# Patient Record
Sex: Male | Born: 1940
Health system: Southern US, Community
[De-identification: ages and names within clinical notes are randomized; demographics above are authoritative.]

## PROBLEM LIST (undated history)

## (undated) DIAGNOSIS — J189 Pneumonia, unspecified organism: Secondary | ICD-10-CM

## (undated) DIAGNOSIS — Z87828 Personal history of other (healed) physical injury and trauma: Secondary | ICD-10-CM

## (undated) DIAGNOSIS — G629 Polyneuropathy, unspecified: Secondary | ICD-10-CM

## (undated) DIAGNOSIS — H9193 Unspecified hearing loss, bilateral: Secondary | ICD-10-CM

## (undated) DIAGNOSIS — E785 Hyperlipidemia, unspecified: Secondary | ICD-10-CM

## (undated) DIAGNOSIS — Z87438 Personal history of other diseases of male genital organs: Secondary | ICD-10-CM

## (undated) DIAGNOSIS — Z87898 Personal history of other specified conditions: Secondary | ICD-10-CM

## (undated) DIAGNOSIS — G839 Paralytic syndrome, unspecified: Secondary | ICD-10-CM

## (undated) DIAGNOSIS — M1990 Unspecified osteoarthritis, unspecified site: Secondary | ICD-10-CM

## (undated) DIAGNOSIS — N400 Enlarged prostate without lower urinary tract symptoms: Secondary | ICD-10-CM

## (undated) DIAGNOSIS — S3992XA Unspecified injury of lower back, initial encounter: Secondary | ICD-10-CM

## (undated) DIAGNOSIS — Z974 Presence of external hearing-aid: Secondary | ICD-10-CM

## (undated) DIAGNOSIS — Z9289 Personal history of other medical treatment: Secondary | ICD-10-CM

## (undated) DIAGNOSIS — N390 Urinary tract infection, site not specified: Secondary | ICD-10-CM

## (undated) DIAGNOSIS — Z973 Presence of spectacles and contact lenses: Secondary | ICD-10-CM

## (undated) DIAGNOSIS — Z972 Presence of dental prosthetic device (complete) (partial): Secondary | ICD-10-CM

## (undated) HISTORY — PX: HERNIA REPAIR: SHX51

## (undated) HISTORY — DX: Hyperlipidemia, unspecified: E78.5

## (undated) HISTORY — DX: Personal history of other medical treatment: Z92.89

## (undated) HISTORY — DX: Unspecified injury of lower back, initial encounter: S39.92XA

## (undated) HISTORY — DX: Polyneuropathy, unspecified: G62.9

---
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
## 1989-10-26 HISTORY — PX: REPAIR OF RECTAL PROLAPSE: SHX6420

## 1989-10-26 HISTORY — PX: RECTAL PROLAPSE REPAIR: SHX759

## 1990-10-26 DIAGNOSIS — Z87828 Personal history of other (healed) physical injury and trauma: Secondary | ICD-10-CM

## 1990-10-26 HISTORY — DX: Personal history of other (healed) physical injury and trauma: Z87.828

## 1990-10-26 HISTORY — PX: OTHER SURGICAL HISTORY: SHX169

## 1994-10-26 DIAGNOSIS — Z87828 Personal history of other (healed) physical injury and trauma: Secondary | ICD-10-CM

## 1994-10-26 DIAGNOSIS — W3400XA Accidental discharge from unspecified firearms or gun, initial encounter: Secondary | ICD-10-CM

## 1994-10-26 HISTORY — DX: Personal history of other (healed) physical injury and trauma: Z87.828

## 1994-10-26 HISTORY — DX: Accidental discharge from unspecified firearms or gun, initial encounter: W34.00XA

## 1994-10-26 HISTORY — PX: FOREIGN BODY REMOVAL: SHX962

## 2006-10-26 HISTORY — PX: INGUINAL HERNIA REPAIR: SUR1180

## 2006-10-26 HISTORY — PX: HERNIA REPAIR: SHX51

## 2009-04-26 ENCOUNTER — Emergency Department (HOSPITAL_COMMUNITY): Admission: EM | Admit: 2009-04-26 | Discharge: 2009-04-26 | Payer: Self-pay | Admitting: Emergency Medicine

## 2009-12-30 ENCOUNTER — Encounter: Admission: RE | Admit: 2009-12-30 | Discharge: 2009-12-30 | Payer: Self-pay | Admitting: Family Medicine

## 2010-01-13 ENCOUNTER — Emergency Department (HOSPITAL_COMMUNITY): Admission: EM | Admit: 2010-01-13 | Discharge: 2010-01-13 | Payer: Self-pay | Admitting: Emergency Medicine

## 2010-02-12 ENCOUNTER — Ambulatory Visit: Payer: Self-pay | Admitting: Thoracic Surgery

## 2010-02-18 DIAGNOSIS — Z9289 Personal history of other medical treatment: Secondary | ICD-10-CM

## 2010-02-18 HISTORY — DX: Personal history of other medical treatment: Z92.89

## 2010-12-04 ENCOUNTER — Other Ambulatory Visit: Payer: Self-pay | Admitting: Family Medicine

## 2010-12-04 ENCOUNTER — Ambulatory Visit
Admission: RE | Admit: 2010-12-04 | Discharge: 2010-12-04 | Disposition: A | Discharge status: Home / Self Care | Payer: Medicare Other | Source: Ambulatory Visit | Attending: Family Medicine | Admitting: Family Medicine

## 2011-01-19 LAB — DIFFERENTIAL
Basophils Absolute: 0 10*3/uL (ref 0.0–0.1)
Basophils Relative: 0 % (ref 0–1)
Eosinophils Absolute: 0.1 10*3/uL (ref 0.0–0.7)
Lymphs Abs: 1.1 10*3/uL (ref 0.7–4.0)
Neutro Abs: 4.2 10*3/uL (ref 1.7–7.7)

## 2011-01-19 LAB — POCT I-STAT, CHEM 8
BUN: 20 mg/dL (ref 6–23)
Chloride: 104 mEq/L (ref 96–112)
Creatinine, Ser: 1.1 mg/dL (ref 0.4–1.5)
HCT: 44 % (ref 39.0–52.0)
Sodium: 138 mEq/L (ref 135–145)

## 2011-01-19 LAB — POCT CARDIAC MARKERS
CKMB, poc: <1 ng/mL — ABNORMAL LOW (ref 1.0–8.0)
Troponin i, poc: <0.05 ng/mL (ref 0.00–0.09)

## 2011-01-19 LAB — CBC
HCT: 42.5 % (ref 39.0–52.0)
Hemoglobin: 14.9 g/dL (ref 13.0–17.0)
RBC: 4.41 MIL/uL (ref 4.22–5.81)
RDW: 13 % (ref 11.5–15.5)

## 2011-01-19 LAB — D-DIMER, QUANTITATIVE: D-Dimer, Quant: 0.49 ug/mL-FEU — ABNORMAL HIGH (ref 0.00–0.48)
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
## 2012-01-21 ENCOUNTER — Encounter: Payer: Self-pay | Admitting: *Deleted

## 2012-01-21 DIAGNOSIS — K219 Gastro-esophageal reflux disease without esophagitis: Secondary | ICD-10-CM

## 2012-01-21 DIAGNOSIS — R079 Chest pain, unspecified: Secondary | ICD-10-CM | POA: Insufficient documentation

## 2012-01-21 DIAGNOSIS — E7800 Pure hypercholesterolemia, unspecified: Secondary | ICD-10-CM

## 2013-09-13 ENCOUNTER — Emergency Department (HOSPITAL_COMMUNITY): Payer: Medicare Other

## 2013-09-13 ENCOUNTER — Encounter (HOSPITAL_COMMUNITY): Payer: Self-pay | Admitting: Emergency Medicine

## 2013-09-13 ENCOUNTER — Emergency Department (HOSPITAL_COMMUNITY)
Admission: EM | Admit: 2013-09-13 | Discharge: 2013-09-13 | Disposition: A | Discharge status: Home / Self Care | Payer: Medicare Other | Attending: Emergency Medicine | Admitting: Emergency Medicine

## 2013-09-13 DIAGNOSIS — Z862 Personal history of diseases of the blood and blood-forming organs and certain disorders involving the immune mechanism: Secondary | ICD-10-CM | POA: Insufficient documentation

## 2013-09-13 DIAGNOSIS — R42 Dizziness and giddiness: Secondary | ICD-10-CM | POA: Insufficient documentation

## 2013-09-13 DIAGNOSIS — Z8639 Personal history of other endocrine, nutritional and metabolic disease: Secondary | ICD-10-CM | POA: Insufficient documentation

## 2013-09-13 DIAGNOSIS — Z791 Long term (current) use of non-steroidal anti-inflammatories (NSAID): Secondary | ICD-10-CM | POA: Insufficient documentation

## 2013-09-13 DIAGNOSIS — Z87898 Personal history of other specified conditions: Secondary | ICD-10-CM

## 2013-09-13 DIAGNOSIS — Z79899 Other long term (current) drug therapy: Secondary | ICD-10-CM | POA: Insufficient documentation

## 2013-09-13 HISTORY — DX: Personal history of other specified conditions: Z87.898

## 2013-09-13 LAB — CBC WITH DIFFERENTIAL/PLATELET
Basophils Relative: 0 % (ref 0–1)
Eosinophils Absolute: 0.1 10*3/uL (ref 0.0–0.7)
Lymphocytes Relative: 31 % (ref 12–46)
MCHC: 36.3 g/dL — ABNORMAL HIGH (ref 30.0–36.0)
Monocytes Relative: 8 % (ref 3–12)
Neutro Abs: 3.7 10*3/uL (ref 1.7–7.7)
Neutrophils Relative %: 60 % (ref 43–77)
RBC: 4.36 MIL/uL (ref 4.22–5.81)
WBC: 6.1 10*3/uL (ref 4.0–10.5)

## 2013-09-13 LAB — COMPREHENSIVE METABOLIC PANEL
AST: 22 U/L (ref 0–37)
Albumin: 4.1 g/dL (ref 3.5–5.2)
BUN: 16 mg/dL (ref 6–23)
CO2: 27 mEq/L (ref 19–32)
Creatinine, Ser: 1.1 mg/dL (ref 0.50–1.35)
GFR calc Af Amer: 77 mL/min — ABNORMAL LOW (ref >90)
Glucose, Bld: 108 mg/dL — ABNORMAL HIGH (ref 70–99)
Sodium: 141 mEq/L (ref 135–145)
Total Bilirubin: 0.6 mg/dL (ref 0.3–1.2)
Total Protein: 7.1 g/dL (ref 6.0–8.3)

## 2013-09-13 LAB — POCT I-STAT TROPONIN I: Troponin i, poc: 0 ng/mL (ref 0.00–0.08)

## 2013-09-13 MED ORDER — MECLIZINE HCL 12.5 MG PO TABS: 12.5000 mg | ORAL_TABLET | Freq: Three times a day (TID) | ORAL | Status: DC | PRN

## 2013-09-13 MED ORDER — MECLIZINE HCL 25 MG PO TABS
25.0000 mg | ORAL_TABLET | Freq: Once | ORAL | Status: AC
  Administered 2013-09-13: 25 mg via ORAL
  Filled 2013-09-13: qty 1

## 2013-09-13 MED ORDER — ONDANSETRON HCL 4 MG PO TABS: 4.0000 mg | ORAL_TABLET | Freq: Four times a day (QID) | ORAL | Status: DC

## 2013-09-13 MED ORDER — LORAZEPAM 2 MG/ML IJ SOLN
1.0000 mg | Freq: Once | INTRAMUSCULAR | Status: AC
  Administered 2013-09-13: 1 mg via INTRAVENOUS
  Filled 2013-09-13: qty 1

## 2013-09-13 MED ORDER — SODIUM CHLORIDE 0.9 % IV BOLUS (SEPSIS)
1000.0000 mL | Freq: Once | INTRAVENOUS | Status: AC
  Administered 2013-09-13: 1000 mL via INTRAVENOUS

## 2013-09-13 NOTE — ED Notes (Signed)
Walked pt to bathroom and back with no issues.Marland Kitchen
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
## 2013-09-13 NOTE — ED Notes (Signed)
The pt has vomited once.  No pain

## 2013-09-13 NOTE — ED Notes (Signed)
The pt is c/o dizziness nausea  Since this am .  If he moves his head fast the dizziness gets worse.  No previous history

## 2014-03-09 ENCOUNTER — Emergency Department (HOSPITAL_COMMUNITY)
Admission: EM | Admit: 2014-03-09 | Discharge: 2014-03-09 | Disposition: A | Discharge status: Home / Self Care | Payer: Medicare Other | Source: Home / Self Care | Attending: Emergency Medicine | Admitting: Emergency Medicine

## 2014-03-09 ENCOUNTER — Encounter (HOSPITAL_COMMUNITY): Payer: Self-pay | Admitting: Emergency Medicine

## 2014-03-09 DIAGNOSIS — J069 Acute upper respiratory infection, unspecified: Secondary | ICD-10-CM

## 2014-03-09 MED ORDER — IPRATROPIUM BROMIDE 0.06 % NA SOLN: 2.0000 | Freq: Four times a day (QID) | NASAL | Status: DC

## 2014-03-09 MED ORDER — BENZONATATE 100 MG PO CAPS: 100.0000 mg | ORAL_CAPSULE | Freq: Three times a day (TID) | ORAL | Status: DC | PRN

## 2014-03-09 NOTE — Discharge Instructions (Signed)
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
## 2014-03-09 NOTE — ED Notes (Signed)
C/o cough States cough is productive with yellow and white mucous States last time he had a cough he took tussinex pearls which did work  Has tried advil but no relief.

## 2014-03-10 MED ORDER — HYDROCOD POLST-CHLORPHEN POLST 10-8 MG/5ML PO LQCR: 5.0000 mL | Freq: Two times a day (BID) | ORAL | Status: DC | PRN

## 2014-03-10 NOTE — ED Provider Notes (Signed)
Medical screening examination/treatment/procedure(s) were performed by non-physician practitioner and as supervising physician I was immediately available for consultation/collaboration.  Philipp Deputy, M.D.  Harden Mo, MD 03/10/14 [phone]

## 2014-03-15 ENCOUNTER — Other Ambulatory Visit: Payer: Self-pay | Admitting: Family Medicine

## 2014-03-15 ENCOUNTER — Ambulatory Visit
Admission: RE | Admit: 2014-03-15 | Discharge: 2014-03-15 | Disposition: A | Discharge status: Home / Self Care | Payer: Medicare Other | Source: Ambulatory Visit | Attending: Family Medicine | Admitting: Family Medicine

## 2014-03-15 DIAGNOSIS — R059 Cough, unspecified: Secondary | ICD-10-CM

## 2014-03-15 DIAGNOSIS — R05 Cough: Secondary | ICD-10-CM

## 2015-03-11 ENCOUNTER — Other Ambulatory Visit: Payer: Self-pay | Admitting: Physician Assistant

## 2015-03-11 ENCOUNTER — Ambulatory Visit
Admission: RE | Admit: 2015-03-11 | Discharge: 2015-03-11 | Disposition: A | Discharge status: Home / Self Care | Payer: Medicare Other | Source: Ambulatory Visit | Attending: Physician Assistant | Admitting: Physician Assistant

## 2015-03-11 DIAGNOSIS — R059 Cough, unspecified: Secondary | ICD-10-CM

## 2015-03-11 DIAGNOSIS — R05 Cough: Secondary | ICD-10-CM

## 2015-03-11 DIAGNOSIS — M5489 Other dorsalgia: Secondary | ICD-10-CM

## 2016-02-17 ENCOUNTER — Other Ambulatory Visit: Payer: Self-pay | Admitting: Family Medicine

## 2016-02-17 ENCOUNTER — Ambulatory Visit
Admission: RE | Admit: 2016-02-17 | Discharge: 2016-02-17 | Disposition: A | Discharge status: Home / Self Care | Payer: Medicare Other | Source: Ambulatory Visit | Attending: Family Medicine | Admitting: Family Medicine

## 2016-02-17 DIAGNOSIS — R05 Cough: Secondary | ICD-10-CM

## 2016-02-17 DIAGNOSIS — R059 Cough, unspecified: Secondary | ICD-10-CM

## 2016-02-25 ENCOUNTER — Emergency Department (HOSPITAL_COMMUNITY): Payer: Medicare Other

## 2016-02-25 ENCOUNTER — Encounter (HOSPITAL_COMMUNITY): Payer: Self-pay | Admitting: Emergency Medicine

## 2016-02-25 ENCOUNTER — Emergency Department (HOSPITAL_COMMUNITY)
Admission: EM | Admit: 2016-02-25 | Discharge: 2016-02-25 | Disposition: A | Discharge status: Home / Self Care | Payer: Medicare Other | Attending: Emergency Medicine | Admitting: Emergency Medicine

## 2016-02-25 DIAGNOSIS — J189 Pneumonia, unspecified organism: Secondary | ICD-10-CM

## 2016-02-25 DIAGNOSIS — R05 Cough: Secondary | ICD-10-CM | POA: Diagnosis present

## 2016-02-25 DIAGNOSIS — Z792 Long term (current) use of antibiotics: Secondary | ICD-10-CM | POA: Insufficient documentation

## 2016-02-25 DIAGNOSIS — J159 Unspecified bacterial pneumonia: Secondary | ICD-10-CM | POA: Insufficient documentation

## 2016-02-25 DIAGNOSIS — Z79899 Other long term (current) drug therapy: Secondary | ICD-10-CM | POA: Insufficient documentation

## 2016-02-25 DIAGNOSIS — Z8639 Personal history of other endocrine, nutritional and metabolic disease: Secondary | ICD-10-CM | POA: Insufficient documentation

## 2016-02-25 DIAGNOSIS — N390 Urinary tract infection, site not specified: Secondary | ICD-10-CM | POA: Insufficient documentation

## 2016-02-25 HISTORY — DX: Pneumonia, unspecified organism: J18.9

## 2016-02-25 LAB — CBC WITH DIFFERENTIAL/PLATELET
Basophils Absolute: 0 10*3/uL (ref 0.0–0.1)
Basophils Relative: 0 %
EOS PCT: 0 %
Eosinophils Absolute: 0 10*3/uL (ref 0.0–0.7)
HCT: 36.1 % — ABNORMAL LOW (ref 39.0–52.0)
Hemoglobin: 12.5 g/dL — ABNORMAL LOW (ref 13.0–17.0)
LYMPHS ABS: 0.6 10*3/uL — AB (ref 0.7–4.0)
Lymphocytes Relative: 7 %
MCH: 32.2 pg (ref 26.0–34.0)
MCHC: 34.6 g/dL (ref 30.0–36.0)
MCV: 93 fL (ref 78.0–100.0)
MONOS PCT: 5 %
Monocytes Absolute: 0.4 10*3/uL (ref 0.1–1.0)
Neutro Abs: 8.3 10*3/uL — ABNORMAL HIGH (ref 1.7–7.7)
Neutrophils Relative %: 88 %
Platelets: 213 10*3/uL (ref 150–400)
RBC: 3.88 MIL/uL — AB (ref 4.22–5.81)
RDW: 13.4 % (ref 11.5–15.5)
WBC: 9.4 10*3/uL (ref 4.0–10.5)

## 2016-02-25 LAB — COMPREHENSIVE METABOLIC PANEL
ALBUMIN: 3.5 g/dL (ref 3.5–5.0)
ALK PHOS: 56 U/L (ref 38–126)
ALT: 18 U/L (ref 17–63)
AST: 25 U/L (ref 15–41)
Anion gap: 10 (ref 5–15)
BUN: 17 mg/dL (ref 6–20)
CHLORIDE: 107 mmol/L (ref 101–111)
CO2: 21 mmol/L — AB (ref 22–32)
CREATININE: 1.08 mg/dL (ref 0.61–1.24)
Calcium: 9 mg/dL (ref 8.9–10.3)
GFR calc non Af Amer: >60 mL/min (ref >60)
GLUCOSE: 166 mg/dL — AB (ref 65–99)
Potassium: 3.9 mmol/L (ref 3.5–5.1)
SODIUM: 138 mmol/L (ref 135–145)
Total Bilirubin: 0.8 mg/dL (ref 0.3–1.2)
Total Protein: 6.5 g/dL (ref 6.5–8.1)

## 2016-02-25 LAB — URINALYSIS, ROUTINE W REFLEX MICROSCOPIC
BILIRUBIN URINE: NEGATIVE
GLUCOSE, UA: NEGATIVE mg/dL
HGB URINE DIPSTICK: NEGATIVE
Ketones, ur: NEGATIVE mg/dL
Nitrite: NEGATIVE
PROTEIN: NEGATIVE mg/dL
Specific Gravity, Urine: 1.021 (ref 1.005–1.030)
pH: 6 (ref 5.0–8.0)

## 2016-02-25 LAB — I-STAT CG4 LACTIC ACID, ED
LACTIC ACID, VENOUS: 0.96 mmol/L (ref 0.5–2.0)
Lactic Acid, Venous: 1.91 mmol/L (ref 0.5–2.0)

## 2016-02-25 LAB — URINE MICROSCOPIC-ADD ON

## 2016-02-25 MED ORDER — DEXTROSE 5 % IV SOLN
1.0000 g | Freq: Once | INTRAVENOUS | Status: AC
  Administered 2016-02-25: 1 g via INTRAVENOUS
  Filled 2016-02-25: qty 10

## 2016-02-25 MED ORDER — HYDROCODONE-HOMATROPINE 5-1.5 MG/5ML PO SYRP: 5.0000 mL | ORAL_SOLUTION | Freq: Four times a day (QID) | ORAL | Status: DC | PRN

## 2016-02-25 MED ORDER — LEVOFLOXACIN 500 MG PO TABS: 500.0000 mg | ORAL_TABLET | Freq: Every day | ORAL | Status: DC

## 2016-02-25 MED ORDER — SODIUM CHLORIDE 0.9 % IV SOLN
INTRAVENOUS | Status: DC
Start: 2016-02-25 — End: 2016-02-26
  Administered 2016-02-25: 21:00:00 via INTRAVENOUS

## 2016-02-25 MED ORDER — AZITHROMYCIN 250 MG PO TABS
500.0000 mg | ORAL_TABLET | Freq: Once | ORAL | Status: AC
  Administered 2016-02-25: 500 mg via ORAL
  Filled 2016-02-25: qty 2
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
## 2016-02-25 NOTE — ED Notes (Signed)
Ambulated patient to and from the restroom.Marland KitchenMarland KitchenPatienthad no problem stated that he felt fine and wasn't dizzy at all.Marland Kitchen

## 2016-02-25 NOTE — Discharge Instructions (Signed)
Community-Acquired Pneumonia, Adult °Pneumonia is an infection of the lungs. There are different types of pneumonia. One type can develop while a person is in a hospital. A different type, called community-acquired pneumonia, develops in people who are not, or have not recently been, in the hospital or other health care facility.  °CAUSES °Pneumonia may be caused by bacteria, viruses, or funguses. Community-acquired pneumonia is often caused by Streptococcus pneumonia bacteria. These bacteria are often passed from one person to another by breathing in droplets from the cough or sneeze of an infected person. °RISK FACTORS °The condition is more likely to develop in: °· People who have chronic diseases, such as chronic obstructive pulmonary disease (COPD), asthma, congestive heart failure, cystic fibrosis, diabetes, or kidney disease. °· People who have early-stage or late-stage HIV. °· People who have sickle cell disease. °· People who have had their spleen removed (splenectomy). °· People who have poor dental hygiene. °· People who have medical conditions that increase the risk of breathing in (aspirating) secretions their own mouth and nose.   °· People who have a weakened immune system (immunocompromised). °· People who smoke. °· People who travel to areas where pneumonia-causing germs commonly exist. °· People who are around animal habitats or animals that have pneumonia-causing germs, including birds, bats, rabbits, cats, and farm animals. °SYMPTOMS °Symptoms of this condition include: °· A dry cough. °· A wet (productive) cough. °· Fever. °· Sweating. °· Chest pain, especially when breathing deeply or coughing. °· Rapid breathing or difficulty breathing. °· Shortness of breath. °· Shaking chills. °· Fatigue. °· Muscle aches. °DIAGNOSIS °Your health care provider will take a medical history and perform a physical exam. You may also have other tests, including: °· Imaging studies of your chest, including  X-rays. °· Tests to check your blood oxygen level and other blood gases. °· Other tests on blood, mucus (sputum), fluid around your lungs (pleural fluid), and urine. °If your pneumonia is severe, other tests may be done to identify the specific cause of your illness. °TREATMENT °The type of treatment that you receive depends on many factors, such as the cause of your pneumonia, the medicines you take, and other medical conditions that you have. For most adults, treatment and recovery from pneumonia may occur at home. In some cases, treatment must happen in a hospital. Treatment may include: °· Antibiotic medicines, if the pneumonia was caused by bacteria. °· Antiviral medicines, if the pneumonia was caused by a virus. °· Medicines that are given by mouth or through an IV tube. °· Oxygen. °· Respiratory therapy. °Although rare, treating severe pneumonia may include: °· Mechanical ventilation. This is done if you are not breathing well on your own and you cannot maintain a safe blood oxygen level. °· Thoracentesis. This procedure removes fluid around one lung or both lungs to help you breathe better. °HOME CARE INSTRUCTIONS °· Take over-the-counter and prescription medicines only as told by your health care provider. °¨ Only take cough medicine if you are losing sleep. Understand that cough medicine can prevent your body's natural ability to remove mucus from your lungs. °¨ If you were prescribed an antibiotic medicine, take it as told by your health care provider. Do not stop taking the antibiotic even if you start to feel better. °· Sleep in a semi-upright position at night. Try sleeping in a reclining chair, or place a few pillows under your head. °· Do not use tobacco products, including cigarettes, chewing tobacco, and e-cigarettes. If you need help quitting, ask your health care provider. °· Drink enough water to keep your urine   clear or pale yellow. This will help to thin out mucus secretions in your  lungs. PREVENTION There are ways that you can decrease your risk of developing community-acquired pneumonia. Consider getting a pneumococcal vaccine if:  You are older than 73 years of age.  You are older than 73 years of age and are undergoing cancer treatment, have chronic lung disease, or have other medical conditions that affect your immune system. Ask your health care provider if this applies to you. There are different types and schedules of pneumococcal vaccines. Ask your health care provider which vaccination option is best for you. You may also prevent community-acquired pneumonia if you take these actions:  Get an influenza vaccine every year. Ask your health care provider which type of influenza vaccine is best for you.  Go to the dentist on a regular basis.  Wash your hands often. Use hand sanitizer if soap and water are not available. SEEK MEDICAL CARE IF:  You have a fever.  You are losing sleep because you cannot control your cough with cough medicine. SEEK IMMEDIATE MEDICAL CARE IF:  You have worsening shortness of breath.  You have increased chest pain.  Your sickness becomes worse, especially if you are an older adult or have a weakened immune system.  You cough up blood.   This information is not intended to replace advice given to you by your health care provider. Make sure you discuss any questions you have with your health care provider.   Document Released: 10/12/2005 Document Revised: 07/03/2015 Document Reviewed: 02/06/2015 Elsevier Interactive Patient Education 2016 Elsevier Inc. Urinary Tract Infection Urinary tract infections (UTIs) can develop anywhere along your urinary tract. Your urinary tract is your body's drainage system for removing wastes and extra water. Your urinary tract includes two kidneys, two ureters, a bladder, and a urethra. Your kidneys are a pair of bean-shaped organs. Each kidney is about the size of your fist. They are located  below your ribs, one on each side of your spine. CAUSES Infections are caused by microbes, which are microscopic organisms, including fungi, viruses, and bacteria. These organisms are so small that they can only be seen through a microscope. Bacteria are the microbes that most commonly cause UTIs. SYMPTOMS  Symptoms of UTIs may vary by age and gender of the patient and by the location of the infection. Symptoms in young women typically include a frequent and intense urge to urinate and a painful, burning feeling in the bladder or urethra during urination. Older women and men are more likely to be tired, shaky, and weak and have muscle aches and abdominal pain. A fever may mean the infection is in your kidneys. Other symptoms of a kidney infection include pain in your back or sides below the ribs, nausea, and vomiting. DIAGNOSIS To diagnose a UTI, your caregiver will ask you about your symptoms. Your caregiver will also ask you to provide a urine sample. The urine sample will be tested for bacteria and white blood cells. White blood cells are made by your body to help fight infection. TREATMENT  Typically, UTIs can be treated with medication. Because most UTIs are caused by a bacterial infection, they usually can be treated with the use of antibiotics. The choice of antibiotic and length of treatment depend on your symptoms and the type of bacteria causing your infection. HOME CARE INSTRUCTIONS  If you were prescribed antibiotics, take them exactly as your caregiver instructs you. Finish the medication even if you feel better after  you have only taken some of the medication.  Drink enough water and fluids to keep your urine clear or pale yellow.  Avoid caffeine, tea, and carbonated beverages. They tend to irritate your bladder.  Empty your bladder often. Avoid holding urine for long periods of time.  Empty your bladder before and after sexual intercourse.  After a bowel movement, women should  cleanse from front to back. Use each tissue only once. SEEK MEDICAL CARE IF:   You have back pain.  You develop a fever.  Your symptoms do not begin to resolve within 3 days. SEEK IMMEDIATE MEDICAL CARE IF:   You have severe back pain or lower abdominal pain.  You develop chills.  You have nausea or vomiting.  You have continued burning or discomfort with urination. MAKE SURE YOU:   Understand these instructions.  Will watch your condition.  Will get help right away if you are not doing well or get worse.   This information is not intended to replace advice given to you by your health care provider. Make sure you discuss any questions you have with your health care provider.   Document Released: 07/22/2005 Document Revised: 07/03/2015 Document Reviewed: 11/20/2011 Elsevier Interactive Patient Education [employer].

## 2016-02-25 NOTE — ED Notes (Signed)
Pt just now being brought to room.

## 2016-02-25 NOTE — ED Notes (Addendum)
Pt comes in from [facility] today for evaluation of pneumonia. Pt has had a cough and fever x 10 days and has had increased fatigue since. Pt alert. Pt also reports light headedness. 500mg  of tylenol given prior to arrival at 1430.

## 2016-02-27 LAB — URINE CULTURE

## 2016-03-01 LAB — CULTURE, BLOOD (ROUTINE X 2)
CULTURE: NO GROWTH
Culture: NO GROWTH

## 2017-10-26 HISTORY — PX: JOINT REPLACEMENT: SHX530

## 2017-11-08 ENCOUNTER — Ambulatory Visit
Admission: RE | Admit: 2017-11-08 | Discharge: 2017-11-08 | Disposition: A | Discharge status: Home / Self Care | Payer: Medicare Other | Source: Ambulatory Visit | Attending: Family Medicine | Admitting: Family Medicine

## 2017-11-08 ENCOUNTER — Other Ambulatory Visit: Payer: Self-pay | Admitting: Family Medicine

## 2017-11-08 DIAGNOSIS — R079 Chest pain, unspecified: Secondary | ICD-10-CM
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
## 2018-05-24 NOTE — Pre-Procedure Instructions (Signed)
Michael Burch  05/24/2018      [place], [place] - [address] [place] [address] Phone: [phone] Fax: [phone]    Your procedure is scheduled on May 31, 2018  Report to [facility] Admitting at 1030 AM.  Call this number if you have problems the morning of surgery:  [phone]   Remember:  Do not eat or drink after midnight.    Take these medicines the morning of surgery with A SIP OF WATER Tylenol-if needed Tramadol-if needed for pain  7 days prior to surgery STOP taking any Aspirin (unless otherwise instructed by your surgeon), Aleve, Naproxen, Ibuprofen, Motrin, Advil, Goody's, BC's, all herbal medications, fish oil, and all vitamins   Do not wear jewelry  Do not wear lotions, powders, or colognes, or deodorant.  Men may shave face and neck the morning of surgery  Do not bring valuables to the hospital.  [facility] is not responsible for any belongings or valuables.  Contacts, dentures or bridgework may not be worn into surgery.  Leave your suitcase in the car.  After surgery it may be brought to your room.  For patients admitted to the hospital, discharge time will be determined by your treatment team.  Patients discharged the day of surgery will not be allowed to drive home.    [facility]- Preparing For Surgery  Before surgery, you can play an important role. Because skin is not sterile, your skin needs to be as free of germs as possible. You can reduce the number of germs on your skin by washing with CHG (chlorahexidine gluconate) Soap before surgery.  CHG is an antiseptic cleaner which kills germs and bonds with the skin to continue killing germs even after washing.    Oral Hygiene is also important to reduce your risk of infection.  Remember - BRUSH YOUR TEETH THE MORNING OF SURGERY WITH YOUR REGULAR TOOTHPASTE  Please do not use if you have an allergy to CHG or  antibacterial soaps. If your skin becomes reddened/irritated stop using the CHG.  Do not shave (including legs and underarms) for at least 48 hours prior to first CHG shower. It is OK to shave your face.  Please follow these instructions carefully.   1. Shower the NIGHT BEFORE SURGERY and the MORNING OF SURGERY with CHG.   2. If you chose to wash your hair, wash your hair first as usual with your normal shampoo.  3. After you shampoo, rinse your hair and body thoroughly to remove the shampoo.  4. Use CHG as you would any other liquid soap. You can apply CHG directly to the skin and wash gently with a scrungie or a clean washcloth.   5. Apply the CHG Soap to your body ONLY FROM THE NECK DOWN.  Do not use on open wounds or open sores. Avoid contact with your eyes, ears, mouth and genitals (private parts). Wash Face and genitals (private parts)  with your normal soap.  6. Wash thoroughly, paying special attention to the area where your surgery will be performed.  7. Thoroughly rinse your body with warm water from the neck down.  8. DO NOT shower/wash with your normal soap after using and rinsing off the CHG Soap.  9. Pat yourself dry with a CLEAN TOWEL.  10. Wear CLEAN PAJAMAS to bed the night before surgery, wear comfortable clothes the morning of surgery  11. Place CLEAN SHEETS on your bed  the night of your first shower and DO NOT SLEEP WITH PETS.  Day of Surgery:  Do not apply any deodorants/lotions.  Please wear clean clothes to the hospital/surgery center.   Remember to brush your teeth WITH YOUR REGULAR TOOTHPASTE.   Please read over the following fact sheets that you were given.

## 2018-05-25 ENCOUNTER — Other Ambulatory Visit: Payer: Self-pay

## 2018-05-25 ENCOUNTER — Encounter (HOSPITAL_COMMUNITY)
Admission: RE | Admit: 2018-05-25 | Discharge: 2018-05-25 | Disposition: A | Discharge status: Home / Self Care | Payer: Medicare Other | Source: Ambulatory Visit | Attending: Orthopedic Surgery | Admitting: Orthopedic Surgery

## 2018-05-25 ENCOUNTER — Encounter (HOSPITAL_COMMUNITY): Payer: Self-pay

## 2018-05-25 DIAGNOSIS — Z01812 Encounter for preprocedural laboratory examination: Secondary | ICD-10-CM | POA: Insufficient documentation

## 2018-05-25 DIAGNOSIS — Z01810 Encounter for preprocedural cardiovascular examination: Secondary | ICD-10-CM | POA: Diagnosis present

## 2018-05-25 DIAGNOSIS — M1712 Unilateral primary osteoarthritis, left knee: Secondary | ICD-10-CM | POA: Insufficient documentation

## 2018-05-25 HISTORY — DX: Unspecified osteoarthritis, unspecified site: M19.90

## 2018-05-25 LAB — BASIC METABOLIC PANEL
Anion gap: 7 (ref 5–15)
BUN: 14 mg/dL (ref 8–23)
CHLORIDE: 109 mmol/L (ref 98–111)
CO2: 24 mmol/L (ref 22–32)
CREATININE: 1.01 mg/dL (ref 0.61–1.24)
Calcium: 9.3 mg/dL (ref 8.9–10.3)
Glucose, Bld: 94 mg/dL (ref 70–99)
POTASSIUM: 4 mmol/L (ref 3.5–5.1)
SODIUM: 140 mmol/L (ref 135–145)

## 2018-05-25 LAB — CBC
HCT: 40 % (ref 39.0–52.0)
Hemoglobin: 13.6 g/dL (ref 13.0–17.0)
MCH: 33.1 pg (ref 26.0–34.0)
MCHC: 34 g/dL (ref 30.0–36.0)
MCV: 97.3 fL (ref 78.0–100.0)
Platelets: 178 10*3/uL (ref 150–400)
RBC: 4.11 MIL/uL — AB (ref 4.22–5.81)
RDW: 12.2 % (ref 11.5–15.5)
WBC: 5.9 10*3/uL (ref 4.0–10.5)

## 2018-05-25 LAB — SURGICAL PCR SCREEN
MRSA, PCR: NEGATIVE
STAPHYLOCOCCUS AUREUS: NEGATIVE

## 2018-05-25 NOTE — Progress Notes (Addendum)
PCP: [name], MD  Cardiologist:pt denies-pt used to see Dr. Einar Gip in the past  EKG: pt denies past year  Stress test: pt denies  ECHO: 01/2010 in [facility]  Cardiac Cath: pt denies  Chest x-ray: 11/08/17  Pt speaks Gujarti, daughter is translating for him, interpretor release signed. Daughter will translate day of surgery per patient request

## 2018-05-31 ENCOUNTER — Inpatient Hospital Stay (HOSPITAL_COMMUNITY): Admission: RE | Admit: 2018-05-31 | Payer: Medicare Other | Source: Ambulatory Visit | Admitting: Orthopedic Surgery

## 2018-05-31 ENCOUNTER — Encounter (HOSPITAL_COMMUNITY): Admission: RE | Payer: Self-pay | Source: Ambulatory Visit

## 2018-05-31 SURGERY — ARTHROPLASTY, KNEE, TOTAL
Anesthesia: Choice | Site: Knee | Laterality: Left

## 2018-06-13 ENCOUNTER — Other Ambulatory Visit (HOSPITAL_COMMUNITY): Payer: Medicare Other
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
## 2018-06-23 ENCOUNTER — Other Ambulatory Visit: Payer: Self-pay

## 2018-06-23 ENCOUNTER — Encounter (HOSPITAL_COMMUNITY): Payer: Self-pay | Admitting: *Deleted

## 2018-06-23 MED ORDER — TRANEXAMIC ACID 1000 MG/10ML IV SOLN
1000.0000 mg | INTRAVENOUS | Status: AC
  Administered 2018-06-24: 1000 mg via INTRAVENOUS
  Filled 2018-06-23: qty 1100

## 2018-06-23 NOTE — Progress Notes (Signed)
Mr colan laymon signed to have family interpreter.  I spoke with his daughter Curley Spice and updated history and instructions.

## 2018-06-24 ENCOUNTER — Ambulatory Visit (HOSPITAL_COMMUNITY): Payer: Medicare Other | Admitting: Anesthesiology

## 2018-06-24 ENCOUNTER — Inpatient Hospital Stay (HOSPITAL_COMMUNITY): Payer: Medicare Other

## 2018-06-24 ENCOUNTER — Encounter (HOSPITAL_COMMUNITY): Payer: Self-pay | Admitting: *Deleted

## 2018-06-24 ENCOUNTER — Encounter (HOSPITAL_COMMUNITY)
Admission: AD | Disposition: A | Discharge status: Home Health | Payer: Self-pay | Source: Home / Self Care | Attending: Orthopedic Surgery

## 2018-06-24 ENCOUNTER — Inpatient Hospital Stay (HOSPITAL_COMMUNITY)
Admission: AD | Admit: 2018-06-24 | Discharge: 2018-06-27 | DRG: 470 | Disposition: A | Discharge status: Home Health | Payer: Medicare Other | Attending: Orthopedic Surgery | Admitting: Orthopedic Surgery

## 2018-06-24 DIAGNOSIS — M1712 Unilateral primary osteoarthritis, left knee: Secondary | ICD-10-CM | POA: Diagnosis present

## 2018-06-24 DIAGNOSIS — Z96652 Presence of left artificial knee joint: Secondary | ICD-10-CM

## 2018-06-24 HISTORY — PX: TOTAL KNEE ARTHROPLASTY: SHX125

## 2018-06-24 LAB — BASIC METABOLIC PANEL
Anion gap: 8 (ref 5–15)
BUN: 16 mg/dL (ref 8–23)
CALCIUM: 9.2 mg/dL (ref 8.9–10.3)
CO2: 24 mmol/L (ref 22–32)
CREATININE: 0.94 mg/dL (ref 0.61–1.24)
Chloride: 109 mmol/L (ref 98–111)
GFR calc Af Amer: >60 mL/min (ref >60)
GFR calc non Af Amer: >60 mL/min (ref >60)
GLUCOSE: 91 mg/dL (ref 70–99)
Potassium: 3.8 mmol/L (ref 3.5–5.1)
SODIUM: 141 mmol/L (ref 135–145)

## 2018-06-24 LAB — CBC
HCT: 40.7 % (ref 39.0–52.0)
Hemoglobin: 13.5 g/dL (ref 13.0–17.0)
MCH: 32.5 pg (ref 26.0–34.0)
MCHC: 33.2 g/dL (ref 30.0–36.0)
MCV: 98.1 fL (ref 78.0–100.0)
Platelets: 153 10*3/uL (ref 150–400)
RBC: 4.15 MIL/uL — AB (ref 4.22–5.81)
RDW: 12.5 % (ref 11.5–15.5)
WBC: 5.2 10*3/uL (ref 4.0–10.5)

## 2018-06-24 SURGERY — ARTHROPLASTY, KNEE, TOTAL
Anesthesia: Regional | Site: Knee | Laterality: Left

## 2018-06-24 MED ORDER — MIDAZOLAM HCL 2 MG/2ML IJ SOLN
1.0000 mg | Freq: Once | INTRAMUSCULAR | Status: AC
  Administered 2018-06-24: 1 mg via INTRAVENOUS

## 2018-06-24 MED ORDER — PROPOFOL 10 MG/ML IV BOLUS
INTRAVENOUS | Status: DC | PRN
  Administered 2018-06-24: 100 mg via INTRAVENOUS

## 2018-06-24 MED ORDER — ASPIRIN 81 MG PO CHEW
81.0000 mg | CHEWABLE_TABLET | Freq: Two times a day (BID) | ORAL | Status: DC
  Administered 2018-06-24 – 2018-06-27 (×6): 81 mg via ORAL
  Filled 2018-06-24 (×6): qty 1

## 2018-06-24 MED ORDER — SODIUM CHLORIDE 0.9 % IV SOLN
INTRAVENOUS | Status: DC
  Administered 2018-06-24: 18:00:00 via INTRAVENOUS

## 2018-06-24 MED ORDER — POLYETHYLENE GLYCOL 3350 17 G PO PACK: 17.0000 g | PACK | Freq: Every day | ORAL | Status: DC | PRN

## 2018-06-24 MED ORDER — ONDANSETRON HCL 4 MG/2ML IJ SOLN: 4.0000 mg | Freq: Four times a day (QID) | INTRAMUSCULAR | Status: DC | PRN

## 2018-06-24 MED ORDER — HYDROMORPHONE HCL 1 MG/ML IJ SOLN
INTRAMUSCULAR | Status: AC
  Filled 2018-06-24: qty 1

## 2018-06-24 MED ORDER — MIDAZOLAM HCL 2 MG/2ML IJ SOLN
INTRAMUSCULAR | Status: AC
  Administered 2018-06-24: 1 mg via INTRAVENOUS
  Filled 2018-06-24: qty 2

## 2018-06-24 MED ORDER — HYDROCODONE-ACETAMINOPHEN 5-325 MG PO TABS
1.0000 | ORAL_TABLET | ORAL | Status: DC | PRN
  Administered 2018-06-24 – 2018-06-27 (×10): 2 via ORAL
  Filled 2018-06-24 (×10): qty 2

## 2018-06-24 MED ORDER — DOCUSATE SODIUM 100 MG PO CAPS
100.0000 mg | ORAL_CAPSULE | Freq: Two times a day (BID) | ORAL | Status: DC
  Administered 2018-06-24 – 2018-06-26 (×4): 100 mg via ORAL
  Filled 2018-06-24 (×6): qty 1

## 2018-06-24 MED ORDER — METHOCARBAMOL 1000 MG/10ML IJ SOLN
500.0000 mg | Freq: Four times a day (QID) | INTRAVENOUS | Status: DC | PRN
  Filled 2018-06-24: qty 5

## 2018-06-24 MED ORDER — ASPIRIN 81 MG PO CHEW: 81.0000 mg | CHEWABLE_TABLET | Freq: Two times a day (BID) | ORAL | 0 refills | Status: DC

## 2018-06-24 MED ORDER — BUPIVACAINE IN DEXTROSE 0.75-8.25 % IT SOLN
INTRATHECAL | Status: DC | PRN
  Administered 2018-06-24: 1.8 mL via INTRATHECAL

## 2018-06-24 MED ORDER — CEFAZOLIN SODIUM-DEXTROSE 2-4 GM/100ML-% IV SOLN
2.0000 g | INTRAVENOUS | Status: AC
  Administered 2018-06-24: 2 g via INTRAVENOUS
  Filled 2018-06-24: qty 100

## 2018-06-24 MED ORDER — PROMETHAZINE HCL 25 MG/ML IJ SOLN: 6.2500 mg | INTRAMUSCULAR | Status: DC | PRN

## 2018-06-24 MED ORDER — HYDROMORPHONE HCL 1 MG/ML IJ SOLN
0.2500 mg | INTRAMUSCULAR | Status: DC | PRN
  Administered 2018-06-24: 0.5 mg via INTRAVENOUS

## 2018-06-24 MED ORDER — METHOCARBAMOL 500 MG PO TABS: 500.0000 mg | ORAL_TABLET | Freq: Three times a day (TID) | ORAL | 1 refills | Status: DC | PRN

## 2018-06-24 MED ORDER — PROPOFOL 500 MG/50ML IV EMUL
INTRAVENOUS | Status: DC | PRN
  Administered 2018-06-24: 50 ug/kg/min via INTRAVENOUS

## 2018-06-24 MED ORDER — PHENYLEPHRINE HCL 10 MG/ML IJ SOLN
INTRAMUSCULAR | Status: DC | PRN
  Administered 2018-06-24: 20 ug/min via INTRAVENOUS

## 2018-06-24 MED ORDER — ADULT MULTIVITAMIN W/MINERALS CH
1.0000 | ORAL_TABLET | Freq: Every day | ORAL | Status: DC
  Administered 2018-06-25 – 2018-06-27 (×3): 1 via ORAL
  Filled 2018-06-24 (×3): qty 1

## 2018-06-24 MED ORDER — TRANEXAMIC ACID 1000 MG/10ML IV SOLN
1000.0000 mg | Freq: Once | INTRAVENOUS | Status: AC
  Administered 2018-06-24: 1000 mg via INTRAVENOUS
  Filled 2018-06-24: qty 10

## 2018-06-24 MED ORDER — ACETAMINOPHEN 325 MG PO TABS
325.0000 mg | ORAL_TABLET | Freq: Four times a day (QID) | ORAL | Status: DC | PRN
  Administered 2018-06-26: 650 mg via ORAL
  Administered 2018-06-26 – 2018-06-27 (×2): 325 mg via ORAL
  Filled 2018-06-24: qty 1
  Filled 2018-06-24: qty 2
  Filled 2018-06-24: qty 1

## 2018-06-24 MED ORDER — PHENOL 1.4 % MT LIQD: 1.0000 | OROMUCOSAL | Status: DC | PRN

## 2018-06-24 MED ORDER — FENTANYL CITRATE (PF) 100 MCG/2ML IJ SOLN
50.0000 ug | Freq: Once | INTRAMUSCULAR | Status: AC
  Administered 2018-06-24: 50 ug via INTRAVENOUS

## 2018-06-24 MED ORDER — HYDROMORPHONE HCL 1 MG/ML IJ SOLN
INTRAMUSCULAR | Status: DC | PRN
  Administered 2018-06-24: 0.5 mg via INTRAVENOUS

## 2018-06-24 MED ORDER — ONDANSETRON HCL 4 MG/2ML IJ SOLN
INTRAMUSCULAR | Status: DC | PRN
  Administered 2018-06-24: 4 mg via INTRAVENOUS

## 2018-06-24 MED ORDER — ONDANSETRON HCL 4 MG PO TABS: 4.0000 mg | ORAL_TABLET | Freq: Four times a day (QID) | ORAL | Status: DC | PRN

## 2018-06-24 MED ORDER — CHLORHEXIDINE GLUCONATE 4 % EX LIQD: 60.0000 mL | Freq: Once | CUTANEOUS | Status: DC

## 2018-06-24 MED ORDER — CEFAZOLIN SODIUM-DEXTROSE 2-4 GM/100ML-% IV SOLN
2.0000 g | Freq: Four times a day (QID) | INTRAVENOUS | Status: AC
  Administered 2018-06-24 – 2018-06-25 (×2): 2 g via INTRAVENOUS
  Filled 2018-06-24 (×2): qty 100

## 2018-06-24 MED ORDER — METOCLOPRAMIDE HCL 5 MG PO TABS: 5.0000 mg | ORAL_TABLET | Freq: Three times a day (TID) | ORAL | Status: DC | PRN

## 2018-06-24 MED ORDER — OXYCODONE HCL 5 MG PO TABS: 5.0000 mg | ORAL_TABLET | Freq: Once | ORAL | Status: DC | PRN

## 2018-06-24 MED ORDER — METOCLOPRAMIDE HCL 5 MG/ML IJ SOLN: 5.0000 mg | Freq: Three times a day (TID) | INTRAMUSCULAR | Status: DC | PRN

## 2018-06-24 MED ORDER — MENTHOL 3 MG MT LOZG: 1.0000 | LOZENGE | OROMUCOSAL | Status: DC | PRN

## 2018-06-24 MED ORDER — EPHEDRINE SULFATE 50 MG/ML IJ SOLN
INTRAMUSCULAR | Status: DC | PRN
  Administered 2018-06-24: 5 mg via INTRAVENOUS

## 2018-06-24 MED ORDER — ROPIVACAINE HCL 5 MG/ML IJ SOLN
INTRAMUSCULAR | Status: DC | PRN
  Administered 2018-06-24: 20 mL via PERINEURAL

## 2018-06-24 MED ORDER — HYDROCODONE-ACETAMINOPHEN 5-325 MG PO TABS: 1.0000 | ORAL_TABLET | ORAL | 0 refills | Status: DC | PRN

## 2018-06-24 MED ORDER — LACTATED RINGERS IV SOLN
INTRAVENOUS | Status: DC
  Administered 2018-06-24: 11:00:00 via INTRAVENOUS

## 2018-06-24 MED ORDER — HYDROMORPHONE HCL 1 MG/ML IJ SOLN
INTRAMUSCULAR | Status: AC
  Filled 2018-06-24: qty 0.5

## 2018-06-24 MED ORDER — METHOCARBAMOL 500 MG PO TABS
500.0000 mg | ORAL_TABLET | Freq: Four times a day (QID) | ORAL | Status: DC | PRN
  Administered 2018-06-24 – 2018-06-25 (×4): 500 mg via ORAL
  Filled 2018-06-24 (×4): qty 1

## 2018-06-24 MED ORDER — OXYCODONE HCL 5 MG/5ML PO SOLN: 5.0000 mg | Freq: Once | ORAL | Status: DC | PRN

## 2018-06-24 MED ORDER — BISACODYL 10 MG RE SUPP: 10.0000 mg | Freq: Every day | RECTAL | Status: DC | PRN

## 2018-06-24 MED ORDER — FENTANYL CITRATE (PF) 100 MCG/2ML IJ SOLN
INTRAMUSCULAR | Status: AC
  Administered 2018-06-24: 50 ug via INTRAVENOUS
  Filled 2018-06-24: qty 2

## 2018-06-24 MED ORDER — FERROUS SULFATE 325 (65 FE) MG PO TABS
325.0000 mg | ORAL_TABLET | Freq: Two times a day (BID) | ORAL | Status: DC
  Administered 2018-06-25 – 2018-06-27 (×5): 325 mg via ORAL
  Filled 2018-06-24 (×5): qty 1

## 2018-06-24 SURGICAL SUPPLY — 63 items
BANDAGE ACE 6X5 VEL STRL LF (GAUZE/BANDAGES/DRESSINGS) ×2 IMPLANT
BANDAGE ESMARK 6X9 LF (GAUZE/BANDAGES/DRESSINGS) ×1 IMPLANT
BLADE SAG 18X100X1.27 (BLADE) ×2 IMPLANT
BLADE SAW SGTL 13X75X1.27 (BLADE) ×2 IMPLANT
BNDG ELASTIC 6X10 VLCR STRL LF (GAUZE/BANDAGES/DRESSINGS) ×2 IMPLANT
BNDG ESMARK 6X9 LF (GAUZE/BANDAGES/DRESSINGS) ×2
BNDG GAUZE ELAST 4 BULKY (GAUZE/BANDAGES/DRESSINGS) ×2 IMPLANT
BOWL SMART MIX CTS (DISPOSABLE) ×2 IMPLANT
CEMENT HV SMART SET (Cement) ×2 IMPLANT
CEMENT TIBIA MBT SIZE 4 (Knees) ×1 IMPLANT
CLSR STERI-STRIP ANTIMIC 1/2X4 (GAUZE/BANDAGES/DRESSINGS) ×2 IMPLANT
COVER SURGICAL LIGHT HANDLE (MISCELLANEOUS) ×2 IMPLANT
CUFF TOURNIQUET SINGLE 34IN LL (TOURNIQUET CUFF) IMPLANT
CUFF TOURNIQUET SINGLE 44IN (TOURNIQUET CUFF) IMPLANT
DRAPE EXTREMITY T 121X128X90 (DRAPE) ×2 IMPLANT
DRAPE HALF SHEET 40X57 (DRAPES) ×2 IMPLANT
DRAPE U-SHAPE 47X51 STRL (DRAPES) ×2 IMPLANT
DRSG ADAPTIC 3X8 NADH LF (GAUZE/BANDAGES/DRESSINGS) ×2 IMPLANT
DRSG PAD ABDOMINAL 8X10 ST (GAUZE/BANDAGES/DRESSINGS) ×4 IMPLANT
DURAPREP 26ML APPLICATOR (WOUND CARE) ×2 IMPLANT
ELECT CAUTERY BLADE 6.4 (BLADE) ×2 IMPLANT
ELECT REM PT RETURN 9FT ADLT (ELECTROSURGICAL) ×2
ELECTRODE REM PT RTRN 9FT ADLT (ELECTROSURGICAL) ×1 IMPLANT
GAUZE SPONGE 4X4 12PLY STRL (GAUZE/BANDAGES/DRESSINGS) ×2 IMPLANT
GLOVE BIOGEL PI ORTHO PRO 7.5 (GLOVE) ×1
GLOVE BIOGEL PI ORTHO PRO SZ8 (GLOVE) ×1
GLOVE ORTHO TXT STRL SZ7.5 (GLOVE) ×2 IMPLANT
GLOVE PI ORTHO PRO STRL 7.5 (GLOVE) ×1 IMPLANT
GLOVE PI ORTHO PRO STRL SZ8 (GLOVE) ×1 IMPLANT
GLOVE SURG ORTHO 8.5 STRL (GLOVE) ×2 IMPLANT
GOWN STRL REUS W/ TWL XL LVL3 (GOWN DISPOSABLE) ×3 IMPLANT
GOWN STRL REUS W/TWL XL LVL3 (GOWN DISPOSABLE) ×3
HANDPIECE INTERPULSE COAX TIP (DISPOSABLE) ×1
IMMOBILIZER KNEE 22 UNIV (SOFTGOODS) IMPLANT
IMPL FEMUR SIGMA LT PS SZ 3 (Knees) ×1 IMPLANT
IMPLANT FEMUR SIGMA LT PS SZ 3 (Knees) ×2 IMPLANT
INSERT PFC SIG STB SZ3 15.0MM (Knees) ×2 IMPLANT
KIT BASIN OR (CUSTOM PROCEDURE TRAY) ×2 IMPLANT
KIT MANIFOLD (MISCELLANEOUS) ×2 IMPLANT
KIT TURNOVER KIT B (KITS) ×2 IMPLANT
MANIFOLD NEPTUNE II (INSTRUMENTS) ×2 IMPLANT
NS IRRIG 1000ML POUR BTL (IV SOLUTION) ×2 IMPLANT
PACK TOTAL JOINT (CUSTOM PROCEDURE TRAY) ×2 IMPLANT
PAD ABD 8X10 STRL (GAUZE/BANDAGES/DRESSINGS) ×2 IMPLANT
PAD ARMBOARD 7.5X6 YLW CONV (MISCELLANEOUS) ×4 IMPLANT
PATELLA DOME PFC 35MM (Knees) ×2 IMPLANT
PIN STEINMAN FIXATION KNEE (PIN) ×2 IMPLANT
SET HNDPC FAN SPRY TIP SCT (DISPOSABLE) ×1 IMPLANT
STRIP CLOSURE SKIN 1/2X4 (GAUZE/BANDAGES/DRESSINGS) ×4 IMPLANT
SUCTION FRAZIER HANDLE 10FR (MISCELLANEOUS) ×1
SUCTION TUBE FRAZIER 10FR DISP (MISCELLANEOUS) ×1 IMPLANT
SUT MNCRL AB 3-0 PS2 18 (SUTURE) ×2 IMPLANT
SUT VIC AB 0 CT1 27 (SUTURE) ×2
SUT VIC AB 0 CT1 27XBRD ANBCTR (SUTURE) ×2 IMPLANT
SUT VIC AB 1 CT1 27 (SUTURE) ×3
SUT VIC AB 1 CT1 27XBRD ANBCTR (SUTURE) ×3 IMPLANT
SUT VIC AB 2-0 CT1 27 (SUTURE) ×2
SUT VIC AB 2-0 CT1 TAPERPNT 27 (SUTURE) ×2 IMPLANT
TIBIA MBT CEMENT SIZE 4 (Knees) ×2 IMPLANT
TOWEL OR 17X24 6PK STRL BLUE (TOWEL DISPOSABLE) ×2 IMPLANT
TOWEL OR 17X26 10 PK STRL BLUE (TOWEL DISPOSABLE) ×2 IMPLANT
TRAY CATH 16FR W/PLASTIC CATH (SET/KITS/TRAYS/PACK) IMPLANT
TRAY FOLEY MTR SLVR 16FR STAT (SET/KITS/TRAYS/PACK) IMPLANT

## 2018-06-24 NOTE — Anesthesia Procedure Notes (Signed)
Anesthesia Regional Block: Adductor canal block   Pre-Anesthetic Checklist: ,, timeout performed, Correct Patient, Correct Site, Correct Laterality, Correct Procedure, Correct Position, site marked, Risks and benefits discussed,  Surgical consent,  Pre-op evaluation,  At surgeon's request and post-op pain management  Laterality: Left  Prep: chloraprep       Needles:  Injection technique: Single-shot  Needle Type: Stimiplex     Needle Length: 9cm  Needle Gauge: 21     Additional Needles:   Procedures:,,,, ultrasound used (permanent image in chart),,,,  Narrative:  Start time: 06/24/2018 11:48 AM End time: 06/24/2018 11:53 AM Injection made incrementally with aspirations every 5 mL.  Performed by: Personally  Anesthesiologist: Lynda Rainwater, MD

## 2018-06-24 NOTE — Anesthesia Procedure Notes (Addendum)
Spinal  Start time: 06/24/2018 1:10 PM End time: 06/24/2018 1:14 PM Staffing Anesthesiologist: Suzette Battiest, MD Performed: anesthesiologist  Preanesthetic Checklist Completed: patient identified, site marked, surgical consent, pre-op evaluation, timeout performed, IV checked, risks and benefits discussed and monitors and equipment checked Spinal Block Patient position: sitting Prep: ChloraPrep and site prepped and draped Patient monitoring: blood pressure, continuous pulse ox and heart rate Approach: midline Location: L4-5 Injection technique: single-shot Needle Needle type: Pencan  Needle gauge: 24 G Needle length: 9 cm

## 2018-06-24 NOTE — Anesthesia Preprocedure Evaluation (Addendum)
Anesthesia Evaluation  Patient identified by MRN, date of birth, ID band Patient awake    Reviewed: Allergy & Precautions, NPO status , Patient's Chart, lab work & pertinent test results  Airway Mallampati: II  TM Distance: >3 FB Neck ROM: Full    Dental no notable dental hx. (+) Partial Lower, Dental Advisory Given   Pulmonary neg pulmonary ROS,    Pulmonary exam normal breath sounds clear to auscultation       Cardiovascular negative cardio ROS Normal cardiovascular exam Rhythm:Regular Rate:Normal     Neuro/Psych negative neurological ROS  negative psych ROS   GI/Hepatic negative GI ROS, Neg liver ROS, GERD  ,  Endo/Other  negative endocrine ROS  Renal/GU negative Renal ROS  negative genitourinary   Musculoskeletal negative musculoskeletal ROS (+) Arthritis , Osteoarthritis,    Abdominal   Peds negative pediatric ROS (+)  Hematology negative hematology ROS (+)   Anesthesia Other Findings   Reproductive/Obstetrics negative OB ROS                            Anesthesia Physical Anesthesia Plan  ASA: II  Anesthesia Plan: Spinal   Post-op Pain Management:  Regional for Post-op pain   Induction: Intravenous  PONV Risk Score and Plan: 1 and Ondansetron  Airway Management Planned: Simple Face Mask  Additional Equipment:   Intra-op Plan:   Post-operative Plan:   Informed Consent: I have reviewed the patients History and Physical, chart, labs and discussed the procedure including the risks, benefits and alternatives for the proposed anesthesia with the patient or authorized representative who has indicated his/her understanding and acceptance.   Dental advisory given  Plan Discussed with: CRNA  Anesthesia Plan Comments:         Anesthesia Quick Evaluation

## 2018-06-24 NOTE — Interval H&P Note (Signed)
History and Physical Interval Note:  06/24/2018 12:51 PM  Michael Burch  has presented today for surgery, with the diagnosis of left knee osteoarthritis  The various methods of treatment have been discussed with the patient and family. After consideration of risks, benefits and other options for treatment, the patient has consented to  Procedure(s): LEFT TOTAL KNEE ARTHROPLASTY (Left) as a surgical intervention .  The patient's history has been reviewed, patient examined, no change in status, stable for surgery.  I have reviewed the patient's chart and labs.  Questions were answered to the patient's satisfaction.     Alixandrea Milleson,STEVEN R

## 2018-06-24 NOTE — Brief Op Note (Signed)
06/24/2018  3:27 PM  PATIENT:  Michael Burch  77 y.o. male  PRE-OPERATIVE DIAGNOSIS:  left knee osteoarthritis, end stage  POST-OPERATIVE DIAGNOSIS:  left knee osteoarthritis, end stage  PROCEDURE:  Procedure(s): LEFT TOTAL KNEE ARTHROPLASTY (Left) DePuy Sigma RP  SURGEON:  Surgeon(s) and Role:    [name], MD - Primary  PHYSICIAN ASSISTANT:   ASSISTANTS: [place] Bruns, PA-C   ANESTHESIA:   regional and general  EBL:  none   BLOOD ADMINISTERED:none  DRAINS: none   LOCAL MEDICATIONS USED:  NONE  SPECIMEN:  No Specimen  DISPOSITION OF SPECIMEN:  N/A  COUNTS:  YES  TOURNIQUET:  * Missing tourniquet times found for documented tourniquets in log: [id number] *  DICTATION: .Other Dictation: Dictation Number [id number]  PLAN OF CARE: Admit to inpatient   PATIENT DISPOSITION:  PACU - hemodynamically stable.   Delay start of Pharmacological VTE agent (>24hrs) due to surgical blood loss or risk of bleeding: no

## 2018-06-24 NOTE — Discharge Instructions (Signed)
Ice to the knee as much as possible.  Ok to put full weight on the left knee/leg.   Keep the incision clean and dry and intact for one week, then ok to get it wet in the shower.  Do exercises as instructed 4 times per day.  Move the knee constantly. Wear the knee brace at night to keep the knee straight and prevent stiffness  Take baby aspirin (81mg ) twice daily for 30 days to prevent blood clots, and wear the support stockings  Follow up in two weeks with Dr Veverly Fells in the office, call [phone] for appt.

## 2018-06-24 NOTE — Anesthesia Procedure Notes (Signed)
Procedure Name: LMA Insertion Date/Time: 06/24/2018 1:48 PM Performed by: Kyung Rudd, CRNA Pre-anesthesia Checklist: Patient identified, Emergency Drugs available, Suction available, Patient being monitored and Timeout performed Patient Re-evaluated:Patient Re-evaluated prior to induction Oxygen Delivery Method: Circle system utilized Preoxygenation: Pre-oxygenation with 100% oxygen Induction Type: IV induction LMA: LMA inserted LMA Size: 4.0 Number of attempts: 1 Placement Confirmation: positive ETCO2 and breath sounds checked- equal and bilateral Tube secured with: Tape Dental Injury: Teeth and Oropharynx as per pre-operative assessment

## 2018-06-24 NOTE — Plan of Care (Signed)
  Problem: Pain Managment: Goal: General experience of comfort will improve Outcome: Progressing   

## 2018-06-24 NOTE — Op Note (Signed)
NAMERICHARDSON, DUBREE MEDICAL RECORD [id number] ACCOUNT [id number] DATE OF BIRTH:Jan 17, 1941 FACILITY: [facility] LOCATION: [facility] PHYSICIAN:STEVEN Orlena Sheldon, MD  OPERATIVE REPORT  DATE OF PROCEDURE:  06/24/2018  PREOPERATIVE DIAGNOSIS:  Left knee end-stage osteoarthritis.  POSTOPERATIVE DIAGNOSIS:  Left knee end-stage osteoarthritis.  PROCEDURE PERFORMED:  Left total knee arthroplasty using DePuy Sigma rotating platform prosthesis.  ATTENDING SURGEON:  Esmond Plants, MD  ASSISTANT:  Darol Destine, [place], who was scrubbed during the entire procedure and necessary for satisfactory completion of surgery.  ANESTHESIA:  General anesthesia was used plus spinal and adductor canal block.  ESTIMATED BLOOD LOSS:  Minimal.  FLUID REPLACEMENT:  1500 mL crystalloid.  INSTRUMENT COUNTS:  Correct.  COMPLICATIONS:  No complications.  ANTIBIOTICS:  Perioperative antibiotics were given.  INDICATIONS:  The patient is a 77 year old male with worsening left knee pain secondary to end-stage osteoarthritis.  The patient has bone-on-bone findings on x-ray and has had progressive pain despite conservative management.  Desires operative  treatment to restore his function and eliminate pain.  Informed consent obtained.  DESCRIPTION OF PROCEDURE:  After adequate level of anesthesia was achieved, the patient was positioned in the supine position.  The left leg was correctly identified.  Nonsterile tourniquet placed to left proximal thigh.  The left leg sterilely prepped  and draped in the usual manner.  Time-out called, verifying correct patient, correct site.  We then elevated the leg, exsanguinated with an Esmarch bandage and elevated the tourniquet to 300 mmHg.  We placed the knee in flexion.  A longitudinal midline  incision with a 10 blade scalpel.  Dissection down through subcutaneous tissues.  We used a fresh 10 blade scalpel for the median parapatellar arthrotomy.  We divided the lateral  patellofemoral ligaments, everted the patella and then flexed the knee,  exposing the distal femur.  We entered the distal femur with a step cut drill and then placed our intramedullary distal femoral resection guide set on 5 degrees left 10 mm.  We resected the distal bone.  We then sized the femur to a size 3 femur anterior  down.  We then went ahead and performed our anterior, posterior and chamfer cuts without complication or difficulty.  We then removed the ACL, PCL, meniscal tissues.  We subluxed the tibia anteriorly and then did a proximal tibial cut perpendicular to  the long axis of the tibia.  We used a size 4 tibia.  We did our modular drill and keel punch for that and placed our trial tibia in position.  We had good coverage and bony support with no overhang.  We then completed our box cut after removing excess  posterior osteophytes with a lamina spreader.  We did our box cut for posterior cruciate substituting femoral prosthesis, which was a size 3 left.  Once we had that completed, we placed our 3 left trial in place and then reduced the knee with initially a  10 and a 12.5 trial.  We felt like we could probably get a 15 on the real, but we started with a 12.5, therefore, the trialing.  We then resurfaced our patella going from a 25 mm thickness down to a 16 mm thickness.  We cut that using a patellar clamp.   Next, we drilled our lug holes for a 35 patellar button.  Once we had that in position, we ranged the knee.  I had wonderful patellar tracking with no-touch technique and good stability.  We removed all trial components, pulse irrigated the bone and  dried it and vacuum mixed the high viscosity DePuy cement on the back table.  We then cemented the components into place, tibia, femur and patella using the patellar clamp and placed the knee in extension with the 15 trial.  That reduced nicely and gave  [place] good stability in both flexion and extension.  Once the cement was hardened, we  trialed the knee and were pleased with our balancing and tension.  We removed all excess cement with quarter inch curved osteotome.  We irrigated thoroughly and inspected  the posterior aspect of the knee.  Once we were confident there was no additional cement to remove, we removed our size 3, 15 poly and inserted the real 15 poly and then had a nice little snap as everything reduced and ranged the knee.  They were both  able to get full extension also stable both in flexion and extension.  We irrigated thoroughly repaired the medial parapatellar arthrotomy with #1 Vicryl suture interrupted followed by 0 and 2-0 Vicryl layered subcutaneous closure and a running 4-0  Monocryl for skin.  Steri-Strips applied followed by sterile dressing.  The patient tolerated surgery well.  TN/NUANCE  D:06/24/2018 T:06/24/2018 JOB:[id number]

## 2018-06-24 NOTE — Progress Notes (Signed)
Orthopedic Tech Progress Note Patient Details:  Michael Burch 10-26-1941 [id number]  CPM Left Knee CPM Left Knee: On Left Knee Flexion (Degrees): 60 Left Knee Extension (Degrees): 0 Additional Comments: foot roll  Post Interventions Patient Tolerated: Well Instructions Provided: Care of device  [name] 06/24/2018, 5:14 PM

## 2018-06-24 NOTE — Transfer of Care (Signed)
Immediate Anesthesia Transfer of Care Note  Patient: Michael Burch  Procedure(s) Performed: LEFT TOTAL KNEE ARTHROPLASTY (Left Knee)  Patient Location: PACU  Anesthesia Type:MAC, General, Regional and Spinal  Level of Consciousness: awake, alert  and patient cooperative  Airway & Oxygen Therapy: Patient Spontanous Breathing and Patient connected to nasal cannula oxygen  Post-op Assessment: Report given to RN and Post -op Vital signs reviewed and stable  Post vital signs: Reviewed and stable  Last Vitals:  Vitals Value Taken Time  BP 127/82 06/24/2018  3:40 PM  Temp    Pulse 67 06/24/2018  3:42 PM  Resp 15 06/24/2018  3:42 PM  SpO2 100 % 06/24/2018  3:42 PM  Vitals shown include unvalidated device data.  Last Pain:  Vitals:   06/24/18 1150  TempSrc:   PainSc: 0-No pain         Complications: No apparent anesthesia complications

## 2018-06-25 DIAGNOSIS — M1712 Unilateral primary osteoarthritis, left knee: Secondary | ICD-10-CM | POA: Diagnosis not present

## 2018-06-25 LAB — CBC
HCT: 38 % — ABNORMAL LOW (ref 39.0–52.0)
Hemoglobin: 12.9 g/dL — ABNORMAL LOW (ref 13.0–17.0)
MCH: 32.8 pg (ref 26.0–34.0)
MCHC: 33.9 g/dL (ref 30.0–36.0)
MCV: 96.7 fL (ref 78.0–100.0)
PLATELETS: 148 10*3/uL — AB (ref 150–400)
RBC: 3.93 MIL/uL — ABNORMAL LOW (ref 4.22–5.81)
RDW: 12.3 % (ref 11.5–15.5)
WBC: 8.3 10*3/uL (ref 4.0–10.5)

## 2018-06-25 LAB — BASIC METABOLIC PANEL
ANION GAP: 5 (ref 5–15)
BUN: 12 mg/dL (ref 8–23)
CO2: 29 mmol/L (ref 22–32)
Calcium: 8.9 mg/dL (ref 8.9–10.3)
Chloride: 104 mmol/L (ref 98–111)
Creatinine, Ser: 1.01 mg/dL (ref 0.61–1.24)
GFR calc Af Amer: >60 mL/min (ref >60)
Glucose, Bld: 130 mg/dL — ABNORMAL HIGH (ref 70–99)
Potassium: 3.9 mmol/L (ref 3.5–5.1)
SODIUM: 138 mmol/L (ref 135–145)

## 2018-06-25 NOTE — Progress Notes (Signed)
Physical Therapy Treatment Patient Details Name: Michael Burch MRN: [id number] DOB: May 17, 1941 Today's Date: 06/25/2018    History of Present Illness Pt is a 77 y/o male s/p elective L TKA. PMH including but not limited to HLD.    PT Comments    Pt making steady progress with functional mobility. He tolerated ambulation in his room with RW and min guard with cueing for sequencing. He continues to require physical assistance for bed mobility and transfers. Pt's family present throughout, very attentive and supportive.   Pt would continue to benefit from skilled physical therapy services at this time while admitted and after d/c to address the below listed limitations in order to improve overall safety and independence with functional mobility.    Follow Up Recommendations  Home health PT;Supervision/Assistance - 24 hour     Equipment Recommendations  Rolling walker with 5" wheels;3in1 (PT)    Recommendations for Other Services       Precautions / Restrictions Precautions Precautions: Fall;Knee Precaution Comments: reviewed positioning precautions with pt and pt's family Restrictions Weight Bearing Restrictions: Yes LLE Weight Bearing: Weight bearing as tolerated    Mobility  Bed Mobility Overal bed mobility: Needs Assistance Bed Mobility: Supine to Sit;Sit to Supine     Supine to sit: Mod assist Sit to supine: Mod assist   General bed mobility comments: increased time and effort, assist with bilateral LE movement off of and onto bed, pt able to use bilateral UEs to elevate trunk; HOB flat  Transfers Overall transfer level: Needs assistance Equipment used: Rolling walker (2 wheeled) Transfers: Sit to/from Stand Sit to Stand: Mod assist Stand pivot transfers: Min assist       General transfer comment: increased time and effort, cueing for technique and sequencing, mod A to power into standing from EOB and for stability with transitional  movement  Ambulation/Gait Ambulation/Gait assistance: Min guard Gait Distance (Feet): 30 Feet Assistive device: Rolling walker (2 wheeled) Gait Pattern/deviations: Step-to pattern;Decreased step length - right;Decreased step length - left;Decreased stride length Gait velocity: decreased Gait velocity interpretation: <1.31 ft/sec, indicative of household ambulator General Gait Details: pt with very slow, cautious and guarded gait; cueing for sequencing with RW, min guard for safety   Stairs             Wheelchair Mobility    Modified Rankin (Stroke Patients Only)       Balance Overall balance assessment: Needs assistance Sitting-balance support: Feet supported Sitting balance-Leahy Scale: Fair     Standing balance support: During functional activity;Bilateral upper extremity supported Standing balance-Leahy Scale: Poor                              Cognition Arousal/Alertness: Awake/alert Behavior During Therapy: Flat affect Overall Cognitive Status: Within Functional Limits for tasks assessed                                 General Comments: pt's is very stoic; family assists with translation      Exercises Total Joint Exercises Heel Slides: AAROM;Left;10 reps;Seated Hip ABduction/ADduction: AAROM;Left;10 reps;Seated    General Comments        Pertinent Vitals/Pain Pain Assessment: Faces Faces Pain Scale: Hurts even more Pain Location: L knee Pain Descriptors / Indicators: Sore Pain Intervention(s): Repositioned;Monitored during session    Home Living Family/patient expects to be discharged to:: Private residence Living Arrangements: Spouse/significant other;Children  Available Help at Discharge: Family;Available 24 hours/day Type of Home: House Home Access: Stairs to enter   Home Layout: One level Home Equipment: Tub bench      Prior Function Level of Independence: Independent          PT Goals (current goals can  now be found in the care plan section) Acute Rehab PT Goals Patient Stated Goal: return home PT Goal Formulation: With patient/family Time For Goal Achievement: 07/09/18 Potential to Achieve Goals: Good Progress towards PT goals: Progressing toward goals    Frequency    7X/week      PT Plan Current plan remains appropriate    Co-evaluation              AM-PAC PT "6 Clicks" Daily Activity  Outcome Measure  Difficulty turning over in bed (including adjusting bedclothes, sheets and blankets)?: Unable Difficulty moving from lying on back to sitting on the side of the bed? : Unable Difficulty sitting down on and standing up from a chair with arms (e.g., wheelchair, bedside commode, etc,.)?: Unable Help needed moving to and from a bed to chair (including a wheelchair)?: A Little Help needed walking in hospital room?: A Little Help needed climbing 3-5 steps with a railing? : A Lot 6 Click Score: 11    End of Session Equipment Utilized During Treatment: Gait belt Activity Tolerance: Patient limited by pain;Patient limited by fatigue Patient left: in bed;with call bell/phone within reach;with family/visitor present Nurse Communication: Mobility status PT Visit Diagnosis: Other abnormalities of gait and mobility (R26.89);Pain Pain - Right/Left: Left Pain - part of body: Knee     Time: 1340-1403 PT Time Calculation (min) (ACUTE ONLY): 23 min  Charges:  $Gait Training: 8-22 mins $Therapeutic Activity: 8-22 mins                     Sherie Don, PT, DPT  Acute Rehabilitation Services Pager [phone] Office [place] 06/25/2018, 2:09 PM

## 2018-06-25 NOTE — Anesthesia Postprocedure Evaluation (Signed)
Anesthesia Post Note  Patient: Michael Burch  Procedure(s) Performed: LEFT TOTAL KNEE ARTHROPLASTY (Left Knee)     Patient location during evaluation: PACU Anesthesia Type: General Level of consciousness: awake and alert Pain management: pain level controlled Vital Signs Assessment: post-procedure vital signs reviewed and stable Respiratory status: spontaneous breathing, nonlabored ventilation, respiratory function stable and patient connected to nasal cannula oxygen Cardiovascular status: blood pressure returned to baseline and stable Postop Assessment: no apparent nausea or vomiting Anesthetic complications: no    Last Vitals:  Vitals:   06/25/18 0050 06/25/18 0640  BP: 120/73 (!) 149/79  Pulse: 72 67  Resp:    Temp: 37.3 C 37.6 C  SpO2: 99% 99%    Last Pain:  Vitals:   06/25/18 0640  TempSrc: Oral  PainSc:                  Tiajuana Amass

## 2018-06-25 NOTE — Progress Notes (Signed)
Patient ID: Michael Burch, male   DOB: 28-Sep-1941, 77 y.o.   MRN: [id number] Subjective: 1 Day Post-Op Procedure(s) (LRB): LEFT TOTAL KNEE ARTHROPLASTY (Left) Patient reports pain as moderate.    Patient has complaints of L knee pain. Some numbness in toes. No other c/o, denies CP, SOB, fever, chills, N/V.  We will start therapy today. Plan is to go home after hospital stay.  Objective: Vital signs in last 24 hours: Temp:  [97 F (36.1 C)-99.2 F (37.3 C)] 99.2 F (37.3 C) (08/31 0050) Pulse Rate:  [58-84] 72 (08/31 0050) Resp:  [7-19] 19 (08/30 1704) BP: (120-166)/(71-85) 120/73 (08/31 0050) SpO2:  [98 %-100 %] 99 % (08/31 0050) Weight:  [58.1 kg] 58.1 kg (08/30 1026)  Intake/Output from previous day:  Intake/Output Summary (Last 24 hours) at 06/25/2018 0818 Last data filed at 06/24/2018 1710 Gross per 24 hour  Intake 1000 ml  Output 1075 ml  Net -75 ml    Intake/Output this shift: No intake/output data recorded.  Labs: Results for orders placed or performed during the hospital encounter of 06/24/18  CBC  Result Value Ref Range   WBC 5.2 4.0 - 10.5 K/uL   RBC 4.15 (L) 4.22 - 5.81 MIL/uL   Hemoglobin 13.5 13.0 - 17.0 g/dL   HCT 40.7 39.0 - 52.0 %   MCV 98.1 78.0 - 100.0 fL   MCH 32.5 26.0 - 34.0 pg   MCHC 33.2 30.0 - 36.0 g/dL   RDW 12.5 11.5 - 15.5 %   Platelets 153 150 - 400 K/uL  Basic metabolic panel  Result Value Ref Range   Sodium 141 135 - 145 mmol/L   Potassium 3.8 3.5 - 5.1 mmol/L   Chloride 109 98 - 111 mmol/L   CO2 24 22 - 32 mmol/L   Glucose, Bld 91 70 - 99 mg/dL   BUN 16 8 - 23 mg/dL   Creatinine, Ser 0.94 0.61 - 1.24 mg/dL   Calcium 9.2 8.9 - 10.3 mg/dL   GFR calc non Af Amer >60 >60 mL/min   GFR calc Af Amer >60 >60 mL/min   Anion gap 8 5 - 15  CBC  Result Value Ref Range   WBC 8.3 4.0 - 10.5 K/uL   RBC 3.93 (L) 4.22 - 5.81 MIL/uL   Hemoglobin 12.9 (L) 13.0 - 17.0 g/dL   HCT 38.0 (L) 39.0 - 52.0 %   MCV 96.7 78.0 - 100.0 fL   MCH 32.8  26.0 - 34.0 pg   MCHC 33.9 30.0 - 36.0 g/dL   RDW 12.3 11.5 - 15.5 %   Platelets 148 (L) 150 - 400 K/uL  Basic metabolic panel  Result Value Ref Range   Sodium 138 135 - 145 mmol/L   Potassium 3.9 3.5 - 5.1 mmol/L   Chloride 104 98 - 111 mmol/L   CO2 29 22 - 32 mmol/L   Glucose, Bld 130 (H) 70 - 99 mg/dL   BUN 12 8 - 23 mg/dL   Creatinine, Ser 1.01 0.61 - 1.24 mg/dL   Calcium 8.9 8.9 - 10.3 mg/dL   GFR calc non Af Amer >60 >60 mL/min   GFR calc Af Amer >60 >60 mL/min   Anion gap 5 5 - 15    Exam - Neurologically intact ABD soft Neurovascular intact Sensation intact distally Intact pulses distally Dorsiflexion/Plantar flexion intact Incision: dressing C/D/I and no drainage No cellulitis present Compartment soft no calf pain or sign of DVT Dressing - clean, dry, no drainage Motor  function intact - moving foot and toes well on exam.   Assessment/Plan: 1 Day Post-Op Procedure(s) (LRB): LEFT TOTAL KNEE ARTHROPLASTY (Left)  Advance diet Up with therapy D/C IV fluids Past Medical History:  Diagnosis Date  . Arthritis   . H/O echocardiogram 02/18/2010   normal lv systolic, mildly impaired LV relaxation,trace MR,Trace TR  . Hyperlipidemia     DVT Prophylaxis - ASA BID Protocol Weight-Bearing as tolerated to Left leg No vaccines. Dressing change tomorrow Plan D/C Monday home  Anticipated LOS equal to or greater than 2 midnights due to - Age 65 and older with one or more of the following:  - Obesity  - Expected need for hospital services (PT, OT, Nursing) required for safe  discharge  - Anticipated need for postoperative skilled nursing care or inpatient rehab  - Active co-morbidities: None   Brennden Masten M. 06/25/2018, 8:18 AM

## 2018-06-25 NOTE — Evaluation (Signed)
Physical Therapy Evaluation Patient Details Name: Michael Burch MRN: [id number] DOB: Sep 19, 1941 Today's Date: 06/25/2018   History of Present Illness  Pt is a 77 y/o male s/p elective L TKA. PMH including but not limited to HLD.  Clinical Impression  Pt presented supine in bed with HOB elevated, awake and willing to participate in therapy session. Pt's daughters and son present throughout, very attentive and helpful. Prior to admission, pt reported that he was independent with all functional mobility and ADLs. Pt will have 24/7 supervision/assistance from spouse and children upon d/c. He currently requires mod A for bed mobility and min A for transfers with RW. PT will continue to follow acutely to progress mobility as tolerated.   Pt would continue to benefit from skilled physical therapy services at this time while admitted and after d/c to address the below listed limitations in order to improve overall safety and independence with functional mobility.     Follow Up Recommendations Home health PT;Supervision/Assistance - 24 hour    Equipment Recommendations  Rolling walker with 5" wheels;3in1 (PT)    Recommendations for Other Services       Precautions / Restrictions Precautions Precautions: Fall;Knee Precaution Comments: reviewed positioning precautions with pt and pt's family Restrictions Weight Bearing Restrictions: Yes LLE Weight Bearing: Weight bearing as tolerated      Mobility  Bed Mobility Overal bed mobility: Needs Assistance Bed Mobility: Supine to Sit     Supine to sit: Mod assist     General bed mobility comments: increased time and effort, min A with bilateral LE movement and mod A to elevate trunk to achieve upright sitting EOB  Transfers Overall transfer level: Needs assistance Equipment used: Rolling walker (2 wheeled) Transfers: Sit to/from [facility] Sit to Stand: Min assist Stand pivot transfers: Min assist       General  transfer comment: increased time and effort, cueing for technique and sequencing, min A for stability with transitional movement  Ambulation/Gait                Stairs            Wheelchair Mobility    Modified Rankin (Stroke Patients Only)       Balance Overall balance assessment: Needs assistance Sitting-balance support: Feet supported Sitting balance-Leahy Scale: Fair     Standing balance support: During functional activity;Bilateral upper extremity supported Standing balance-Leahy Scale: Poor                               Pertinent Vitals/Pain Pain Assessment: Faces Faces Pain Scale: Hurts little more Pain Location: L knee Pain Descriptors / Indicators: Sore Pain Intervention(s): Monitored during session;Repositioned    Home Living Family/patient expects to be discharged to:: Private residence Living Arrangements: Spouse/significant other;Children Available Help at Discharge: Family;Available 24 hours/day Type of Home: House Home Access: Stairs to enter   [employer] of Steps: 1 Home Layout: One level Home Equipment: Tub bench      Prior Function Level of Independence: Independent               Hand Dominance        Extremity/Trunk Assessment   Upper Extremity Assessment Upper Extremity Assessment: Generalized weakness    Lower Extremity Assessment Lower Extremity Assessment: Generalized weakness;LLE deficits/detail LLE Deficits / Details: pt with decreased strength and ROM limitations secondary to post-op pain and weakness. Sensation grossly intact to light touch LLE: Unable to fully  assess due to pain       Communication   Communication: Prefers language other than English  Cognition Arousal/Alertness: Awake/alert Behavior During Therapy: Flat affect Overall Cognitive Status: Within Functional Limits for tasks assessed                                        General Comments       Exercises Total Joint Exercises Heel Slides: AAROM;Left;10 reps;Seated Hip ABduction/ADduction: AAROM;Left;10 reps;Seated   Assessment/Plan    PT Assessment Patient needs continued PT services  PT Problem List Decreased strength;Decreased range of motion;Decreased activity tolerance;Decreased balance;Decreased mobility;Decreased coordination;Decreased knowledge of use of DME;Decreased knowledge of precautions;Decreased safety awareness;Pain       PT Treatment Interventions DME instruction;Gait training;Stair training;Therapeutic activities;Functional mobility training;Therapeutic exercise;Balance training;Neuromuscular re-education;Patient/family education    PT Goals (Current goals can be found in the Care Plan section)  Acute Rehab PT Goals Patient Stated Goal: return home PT Goal Formulation: With patient/family Time For Goal Achievement: 07/09/18 Potential to Achieve Goals: Good    Frequency 7X/week   Barriers to discharge        Co-evaluation               AM-PAC PT "6 Clicks" Daily Activity  Outcome Measure Difficulty turning over in bed (including adjusting bedclothes, sheets and blankets)?: Unable Difficulty moving from lying on back to sitting on the side of the bed? : Unable Difficulty sitting down on and standing up from a chair with arms (e.g., wheelchair, bedside commode, etc,.)?: Unable Help needed moving to and from a bed to chair (including a wheelchair)?: A Little Help needed walking in hospital room?: A Little Help needed climbing 3-5 steps with a railing? : A Lot 6 Click Score: 11    End of Session Equipment Utilized During Treatment: Gait belt Activity Tolerance: Patient tolerated treatment well Patient left: in chair;with call bell/phone within reach;with family/visitor present Nurse Communication: Mobility status PT Visit Diagnosis: Other abnormalities of gait and mobility (R26.89);Pain Pain - Right/Left: Left Pain - part of body: Knee     Time: 6381-7711 PT Time Calculation (min) (ACUTE ONLY): 35 min   Charges:   PT Evaluation $PT Eval Moderate Complexity: 1 Mod PT Treatments $Therapeutic Activity: 8-22 mins        Sherie Don, PT, DPT  Acute Rehabilitation Services Pager [phone] Office [place] 06/25/2018, 11:16 AM

## 2018-06-26 DIAGNOSIS — M1712 Unilateral primary osteoarthritis, left knee: Secondary | ICD-10-CM | POA: Diagnosis present

## 2018-06-26 LAB — CBC
HCT: 37.3 % — ABNORMAL LOW (ref 39.0–52.0)
Hemoglobin: 12.9 g/dL — ABNORMAL LOW (ref 13.0–17.0)
MCH: 33 pg (ref 26.0–34.0)
MCHC: 34.6 g/dL (ref 30.0–36.0)
MCV: 95.4 fL (ref 78.0–100.0)
PLATELETS: 139 10*3/uL — AB (ref 150–400)
RBC: 3.91 MIL/uL — ABNORMAL LOW (ref 4.22–5.81)
RDW: 12.2 % (ref 11.5–15.5)
WBC: 8.9 10*3/uL (ref 4.0–10.5)

## 2018-06-26 NOTE — Progress Notes (Signed)
Lamar Meter  MRN: [id number] DOB/Age: 01-01-1941 77 y.o. [facility] Procedure: Procedure(s) (LRB): LEFT TOTAL KNEE ARTHROPLASTY (Left)     Subjective: Just medicated. Family in room to interpret, no major complaints  Vital Signs Temp:  [98.9 F (37.2 C)-99.4 F (37.4 C)] 98.9 F (37.2 C) (09/01 0535) Pulse Rate:  [69-86] 79 (09/01 0535) Resp:  [14] 14 (08/31 1500) BP: (132-152)/(82-91) 132/82 (09/01 0535) SpO2:  [97 %-100 %] 97 % (09/01 0535)  Lab Results Recent Labs    06/25/18 0430 06/26/18 0422  WBC 8.3 8.9  HGB 12.9* 12.9*  HCT 38.0* 37.3*  PLT 148* 139*   BMET Recent Labs    06/24/18 1024 06/25/18 0430  NA 141 138  K 3.8 3.9  CL 109 104  CO2 24 29  GLUCOSE 91 130*  BUN 16 12  CREATININE 0.94 1.01  CALCIUM 9.2 8.9   No results found for: INR   Exam Left knee dressing removed Incision looks good Aquacel applied NVI        Plan Cont PT/ OT Plan is to DC home once goals have been met  [facility] PA-C  06/26/2018, 9:40 AM Contact # [phone]

## 2018-06-26 NOTE — Progress Notes (Signed)
Physical Therapy Treatment Patient Details Name: Michael Burch MRN: [id number] DOB: Aug 06, 1941 Today's Date: 06/26/2018    History of Present Illness Pt is a 77 y/o male s/p elective L TKA. PMH including but not limited to HLD.    PT Comments    Patient with slow but steady progress with functional mobility this session. Increased ambulation distance to 50 feet using walker and min guard assist. Continues to require min-mod assist for transfers. Able to negotiate 1 platform step using walker to simulate home environment. Seated knee flexion ROM 70 degrees. Overall, suspect he will continue to progress as pain is better controlled and he has excellent family support.    Follow Up Recommendations  Home health PT;Supervision/Assistance - 24 hour     Equipment Recommendations  Rolling walker with 5" wheels;3in1 (PT)    Recommendations for Other Services       Precautions / Restrictions Precautions Precautions: Fall;Knee Restrictions Weight Bearing Restrictions: Yes LLE Weight Bearing: Weight bearing as tolerated    Mobility  Bed Mobility Overal bed mobility: Needs Assistance Bed Mobility: Supine to Sit;Sit to Supine     Supine to sit: Mod assist     General bed mobility comments: PT assisted LLE off of bed while pt son elevated trunk   Transfers Overall transfer level: Needs assistance Equipment used: Rolling walker (2 wheeled) Transfers: Sit to/from Stand Sit to Stand: Min assist         General transfer comment: increased time and effort with cueing for left knee extension prior to sitting for pain control.   Ambulation/Gait Ambulation/Gait assistance: Min guard Gait Distance (Feet): 50 Feet Assistive device: Rolling walker (2 wheeled) Gait Pattern/deviations: Step-to pattern;Decreased stride length;Decreased weight shift to left;Narrow base of support Gait velocity: decreased Gait velocity interpretation: <1.31 ft/sec, indicative of household  ambulator General Gait Details: pt with improved sequencing this session and cues provided for walker proximity. pt continues with very slow, guarded gait. needs occasional assist for walker steering   Stairs Stairs: Yes Stairs assistance: Min guard Stair Management: Forwards;With walker Number of Stairs: 1 General stair comments: patient able to negotiate platform step with walker after demonstration for technique   Wheelchair Mobility    Modified Rankin (Stroke Patients Only)       Balance Overall balance assessment: Needs assistance Sitting-balance support: Feet supported Sitting balance-Leahy Scale: Fair     Standing balance support: During functional activity;Bilateral upper extremity supported Standing balance-Leahy Scale: Poor                              Cognition Arousal/Alertness: Awake/alert Behavior During Therapy: Flat affect Overall Cognitive Status: Within Functional Limits for tasks assessed                                 General Comments: pt son assists with translation      Exercises Total Joint Exercises Quad Sets: Left;10 reps;Supine Goniometric ROM: Seated knee flexion: 70 degrees    General Comments        Pertinent Vitals/Pain Pain Assessment: Faces Faces Pain Scale: Hurts even more Pain Location: L knee Pain Descriptors / Indicators: Sore Pain Intervention(s): Monitored during session    Home Living                      Prior Function  PT Goals (current goals can now be found in the care plan section) Acute Rehab PT Goals Patient Stated Goal: return home PT Goal Formulation: With patient/family Time For Goal Achievement: 07/09/18 Potential to Achieve Goals: Good Progress towards PT goals: Progressing toward goals    Frequency    7X/week      PT Plan Current plan remains appropriate    Co-evaluation              AM-PAC PT "6 Clicks" Daily Activity  Outcome  Measure  Difficulty turning over in bed (including adjusting bedclothes, sheets and blankets)?: Unable Difficulty moving from lying on back to sitting on the side of the bed? : Unable Difficulty sitting down on and standing up from a chair with arms (e.g., wheelchair, bedside commode, etc,.)?: Unable Help needed moving to and from a bed to chair (including a wheelchair)?: A Little Help needed walking in hospital room?: A Little Help needed climbing 3-5 steps with a railing? : A Lot 6 Click Score: 11    End of Session Equipment Utilized During Treatment: Gait belt Activity Tolerance: Patient tolerated treatment well Patient left: in chair;with family/visitor present;with call bell/phone within reach Nurse Communication: Mobility status PT Visit Diagnosis: Other abnormalities of gait and mobility (R26.89);Pain Pain - Right/Left: Left Pain - part of body: Knee     Time: 1116-1150 PT Time Calculation (min) (ACUTE ONLY): 34 min  Charges:  $Gait Training: 8-22 mins $Therapeutic Activity: 8-22 mins                     Ellamae Sia, PT, DPT Acute Rehabilitation Services  Pager: [phone]    Willy Eddy 06/26/2018, 2:45 PM

## 2018-06-26 NOTE — Plan of Care (Signed)
  Problem: Pain Managment: Goal: General experience of comfort will improve Outcome: Progressing   

## 2018-06-27 DIAGNOSIS — M1712 Unilateral primary osteoarthritis, left knee: Secondary | ICD-10-CM | POA: Diagnosis not present

## 2018-06-27 LAB — CBC
HEMATOCRIT: 37.1 % — AB (ref 39.0–52.0)
HEMOGLOBIN: 12.7 g/dL — AB (ref 13.0–17.0)
MCH: 32.6 pg (ref 26.0–34.0)
MCHC: 34.2 g/dL (ref 30.0–36.0)
MCV: 95.4 fL (ref 78.0–100.0)
Platelets: 143 10*3/uL — ABNORMAL LOW (ref 150–400)
RBC: 3.89 MIL/uL — ABNORMAL LOW (ref 4.22–5.81)
RDW: 12.3 % (ref 11.5–15.5)
WBC: 8.8 10*3/uL (ref 4.0–10.5)

## 2018-06-27 NOTE — Progress Notes (Signed)
Physical Therapy Treatment Patient Details Name: Michael Burch MRN: [id number] DOB: [birth year] Today's Date: 06/27/2018    History of Present Illness Pt is a 77 y/o male s/p elective L TKA. PMH including but not limited to HLD.    PT Comments    Pt performed multiple trials of curb training at session this am to ensure technique for carryover entering home. Family continues to remain supportive.  Reviewed HEP and issued HEP with instruction on frequency.  Family verbalizes understanding and patient participated in all exercises to ensure technique is correct.      Follow Up Recommendations  Home health PT;Supervision/Assistance - 24 hour     Equipment Recommendations  Rolling walker with 5" wheels;3in1 (PT)    Recommendations for Other Services       Precautions / Restrictions Precautions Precautions: Fall;Knee Precaution Comments: reviewed positioning precautions with pt and pt's family Restrictions Weight Bearing Restrictions: Yes LLE Weight Bearing: Weight bearing as tolerated    Mobility  Bed Mobility               General bed mobility comments: Pt sitting in recliner chair on arrival this am.    Transfers Overall transfer level: Needs assistance Equipment used: Rolling walker (2 wheeled) Transfers: Sit to/from Stand Sit to Stand: Supervision Stand pivot transfers: Supervision       General transfer comment: Cues for hand placement to and from seated surface. Pt performed with ease,    Ambulation/Gait Ambulation/Gait assistance: Min guard Gait Distance (Feet): 200 Feet Assistive device: Rolling walker (2 wheeled) Gait Pattern/deviations: Step-to pattern;Decreased stride length;Decreased weight shift to left;Narrow base of support;Step-through pattern;Trunk flexed Gait velocity: decreased   General Gait Details: Cues for progression to step through pattern.  Pt required cues for forward gaze.  Cues for gt symmetry.     Stairs Stairs: Yes Stairs  assistance: Min guard Stair Management: Forwards;With walker Number of Stairs: 6 General stair comments: Performed multiple times to ensure technique.  Pt is slow and guarded.  Family present to ensure carryover at home.    Wheelchair Mobility    Modified Rankin (Stroke Patients Only)       Balance     Sitting balance-Leahy Scale: Fair       Standing balance-Leahy Scale: Poor                              Cognition Arousal/Alertness: Awake/alert Behavior During Therapy: Flat affect Overall Cognitive Status: Within Functional Limits for tasks assessed                                 General Comments: Pt's family assists with translation.        Exercises Total Joint Exercises Ankle Circles/Pumps: AROM;Both;20 reps;Supine Quad Sets: AROM;Left;10 reps;Supine Towel Squeeze: AROM;Both;10 reps;Supine Short Arc Quad: AROM;Left;10 reps;Supine Heel Slides: AROM;Left;10 reps;Supine Hip ABduction/ADduction: AAROM;Left;10 reps;Supine Straight Leg Raises: AROM;Left;10 reps;Supine    General Comments        Pertinent Vitals/Pain Pain Assessment: Faces Faces Pain Scale: Hurts even more Pain Location: L knee Pain Descriptors / Indicators: Sore Pain Intervention(s): Monitored during session;Repositioned    Home Living                      Prior Function            PT Goals (current goals can now be found in  the care plan section) Acute Rehab PT Goals Patient Stated Goal: return home Potential to Achieve Goals: Good Progress towards PT goals: Progressing toward goals    Frequency    7X/week      PT Plan Current plan remains appropriate    Co-evaluation              AM-PAC PT "6 Clicks" Daily Activity  Outcome Measure  Difficulty turning over in bed (including adjusting bedclothes, sheets and blankets)?: Unable Difficulty moving from lying on back to sitting on the side of the bed? : Unable Difficulty sitting down  on and standing up from a chair with arms (e.g., wheelchair, bedside commode, etc,.)?: Unable Help needed moving to and from a bed to chair (including a wheelchair)?: A Little Help needed walking in hospital room?: A Little Help needed climbing 3-5 steps with a railing? : A Lot 6 Click Score: 11    End of Session Equipment Utilized During Treatment: Gait belt Activity Tolerance: Patient tolerated treatment well Patient left: in chair;with family/visitor present;with call bell/phone within reach Nurse Communication: Mobility status PT Visit Diagnosis: Other abnormalities of gait and mobility (R26.89);Pain Pain - Right/Left: Left Pain - part of body: Knee     Time: 4332-9518 PT Time Calculation (min) (ACUTE ONLY): 33 min  Charges:  $Gait Training: 8-22 mins $Therapeutic Exercise: 8-22 mins                     [name], PTA pager [phone]    [name] 06/27/2018, 2:51 PM

## 2018-06-27 NOTE — Progress Notes (Signed)
Vaughan Sine to be D/C'd Home per MD order.  Discussed prescriptions and follow up appointments with the patient. Prescriptions given to patient, medication list explained in detail. Pt verbalized understanding.  Allergies as of 06/27/2018   No Known Allergies     Medication List    TAKE these medications   aspirin 81 MG chewable tablet Chew 1 tablet (81 mg total) by mouth 2 (two) times daily.   HYDROcodone-acetaminophen 5-325 MG tablet Commonly known as:  NORCO/VICODIN Take 0.5 tablets by mouth 2 (two) times daily. What changed:  Another medication with the same name was added. Make sure you understand how and when to take each.   HYDROcodone-acetaminophen 5-325 MG tablet Commonly known as:  NORCO/VICODIN Take 1-2 tablets by mouth every 4 (four) hours as needed for moderate pain or severe pain. What changed:  You were already taking a medication with the same name, and this prescription was added. Make sure you understand how and when to take each.   methocarbamol 500 MG tablet Commonly known as:  ROBAXIN Take 1 tablet (500 mg total) by mouth 3 (three) times daily as needed.   multivitamin with minerals Tabs tablet Take 1 tablet by mouth daily.            Durable Medical Equipment  (From admission, onward)         Start     Ordered   06/27/18 1005  For home use only DME Continuous passive motion machine  Once    Comments:  Per MD order, 0-90 degrees, can gradually move up to 90 degrees, started at 70 degrees today.   06/27/18 1005   06/27/18 0932  For home use only DME Walker rolling  Once    Question:  Patient needs a walker to treat with the following condition  Answer:  S/P total knee arthroplasty, left   06/27/18 0932   06/27/18 0932  For home use only DME 3 n 1  Once     06/27/18 0932           Discharge Care Instructions  (From admission, onward)         Start     Ordered   06/27/18 0000  Weight bearing as tolerated    Question Answer Comment   Laterality left   Extremity Lower      06/27/18 0742          Vitals:   06/26/18 2254 06/27/18 0445  BP:  127/77  Pulse:  84  Resp:  16  Temp: 99.2 F (37.3 C) 99.6 F (37.6 C)  SpO2:  99%    Skin clean, dry and intact without evidence of skin break down, no evidence of skin tears noted. Aquacel dressing on left knee, clean, dry, and intact. Patient and family educated on equipment and discharge instructions, family and patient verbalized understanding. IV catheter discontinued intact. Site without signs and symptoms of complications. Dressing and pressure applied. Pt denies pain at this time. No complaints noted.  An After Visit Summary and prescriptions were printed and given to the patient. Patient escorted via [facility], and D/C home via private auto.  [place] RN

## 2018-06-27 NOTE — Care Management Note (Signed)
Case Management Note  Patient Details  Name: Michael Burch MRN: [id number] Date of Birth: 1941-07-02  Subjective/Objective:  77 yr old gentleman s/p left total knee arthroplasty.                  Action/Plan: Patient was preoperatively setup with [facility], no changes. Will have family support at discharge.    Expected Discharge Date:  06/27/18               Expected Discharge Plan:  [place]  In-House Referral:  NA  Discharge planning Services  CM Consult  Post Acute Care Choice:  Durable Medical Equipment, Home Health Choice offered to:  Patient  DME Arranged:  3-N-1, Walker rolling DME Agency:  [place]:  PT [place] Agency:  [facility] (formerly [facility])  Status of Service:  Completed, signed off  If discussed at [employer], dates discussed:    Additional Comments:  Ninfa Meeker, RN 06/27/2018, 9:33 AM

## 2018-06-27 NOTE — Progress Notes (Signed)
Subjective: 3 Days Post-Op Procedure(s) (LRB): LEFT TOTAL KNEE ARTHROPLASTY (Left) Patient reports pain as moderate.  Well controlled with oral meds.  No c/o.  Family at bedside.  Eager to go home.  TOlerating regular diet.  Objective: Vital signs in last 24 hours: Temp:  [99.2 F (37.3 C)-102.3 F (39.1 C)] 99.6 F (37.6 C) (09/02 0445) Pulse Rate:  [84-93] 84 (09/02 0445) Resp:  [16] 16 (09/02 0445) BP: (127-139)/(77-88) 127/77 (09/02 0445) SpO2:  [98 %-99 %] 99 % (09/02 0445)  Intake/Output from previous day: No intake/output data recorded. Intake/Output this shift: No intake/output data recorded.  Recent Labs    06/24/18 1024 06/25/18 0430 06/26/18 0422 06/27/18 0415  HGB 13.5 12.9* 12.9* 12.7*   Recent Labs    06/26/18 0422 06/27/18 0415  WBC 8.9 8.8  RBC 3.91* 3.89*  HCT 37.3* 37.1*  PLT 139* 143*   Recent Labs    06/24/18 1024 06/25/18 0430  NA 141 138  K 3.8 3.9  CL 109 104  CO2 24 29  BUN 16 12  CREATININE 0.94 1.01  GLUCOSE 91 130*  CALCIUM 9.2 8.9   No results for input(s): LABPT, INR in the last 72 hours.  PE:  wn wd man in nad.  L knee dressed and dry.  NVI at L foot.  Anticipated LOS equal to or greater than 2 midnights due to - Age 54 and older with one or more of the following:  - Obesity  - Expected need for hospital services (PT, OT, Nursing) required for safe  discharge  - Anticipated need for postoperative skilled nursing care or inpatient rehab  - Active co-morbidities: Coronary Artery Disease OR   - Unanticipated findings during/Post Surgery: None  - Patient is a high risk of re-admission due to: None   Assessment/Plan: 3 Days Post-Op Procedure(s) (LRB): LEFT TOTAL KNEE ARTHROPLASTY (Left) Discharge home with home health    Wylene Simmer 06/27/2018, 7:42 AM

## 2018-06-28 ENCOUNTER — Encounter (HOSPITAL_COMMUNITY): Payer: Self-pay | Admitting: Orthopedic Surgery

## 2018-06-29 NOTE — Discharge Summary (Signed)
Orthopedic Discharge Summary        Physician Discharge Summary  Patient ID: Michael Burch MRN: [id number] DOB/AGE: 75-Dec-1942 75 y.o.  Admit date: 06/24/2018 Discharge date: 06/27/18   Procedures:  Procedure(s) (LRB): LEFT TOTAL KNEE ARTHROPLASTY (Left)  Attending Physician:  Dr. Esmond Plants  Admission Diagnoses:   Left knee end stage osteoarthritis  Discharge Diagnoses:  Left knee end stage osteoarthritis   Past Medical History:  Diagnosis Date  . Arthritis   . H/O echocardiogram 02/18/2010   normal lv systolic, mildly impaired LV relaxation,trace MR,Trace TR  . Hyperlipidemia     PCP: [name], MD   Discharged Condition: good  Hospital Course:  Patient underwent the above stated procedure on 06/24/2018. Patient tolerated the procedure well and brought to the recovery room in good condition and subsequently to the floor. Patient had an uncomplicated hospital course and was stable for discharge.   Disposition:  with follow up in 2 weeks   Follow-up Information    [name], MD. Call in 2 weeks.   Specialty:  Orthopedic Surgery Why:  [phone] Contact information: [address] [place] [address] [phone]        [facility] Follow up.   Specialty:  [place] Why:  A representative from [facility] will contact you to arrange start date and time for your therapy. Contact information: [address] [place] [address] [phone]           Discharge Instructions    CPM   Complete by:  As directed    Call MD / Call 911   Complete by:  As directed    If you experience chest pain or shortness of breath, CALL 911 and be transported to the hospital emergency room.  If you develope a fever above 101 F, pus (white drainage) or increased drainage or redness at the wound, or calf pain, call your surgeon's office.   Constipation Prevention   Complete by:  As directed    Drink plenty  of fluids.  Prune juice may be helpful.  You may use a stool softener, such as Colace (over the counter) 100 mg twice a day.  Use MiraLax (over the counter) for constipation as needed.   Diet - low sodium heart healthy   Complete by:  As directed    Do not put a pillow under the knee. Place it under the heel.   Complete by:  As directed    Increase activity slowly as tolerated   Complete by:  As directed    Weight bearing as tolerated   Complete by:  As directed    Laterality:  left   Extremity:  Lower      Allergies as of 06/27/2018   No Known Allergies     Medication List    TAKE these medications   aspirin 81 MG chewable tablet Chew 1 tablet (81 mg total) by mouth 2 (two) times daily.   HYDROcodone-acetaminophen 5-325 MG tablet Commonly known as:  NORCO/VICODIN Take 0.5 tablets by mouth 2 (two) times daily. What changed:  Another medication with the same name was added. Make sure you understand how and when to take each.   HYDROcodone-acetaminophen 5-325 MG tablet Commonly known as:  NORCO/VICODIN Take 1-2 tablets by mouth every 4 (four) hours as needed for moderate pain or severe pain. What changed:  You were already taking a medication with the same name, and this prescription was added.  Make sure you understand how and when to take each.   methocarbamol 500 MG tablet Commonly known as:  ROBAXIN Take 1 tablet (500 mg total) by mouth 3 (three) times daily as needed.   multivitamin with minerals Tabs tablet Take 1 tablet by mouth daily.            Discharge Care Instructions  (From admission, onward)         Start     Ordered   06/27/18 0000  Weight bearing as tolerated    Question Answer Comment  Laterality left   Extremity Lower      06/27/18 0742            Signed: Ventura Bruns 06/29/2018, 9:47 AM  [facility] is now [employer] [address], [place], [place], [place] [address] Phone: [place]

## 2018-11-26 DIAGNOSIS — N390 Urinary tract infection, site not specified: Secondary | ICD-10-CM

## 2018-11-26 HISTORY — DX: Urinary tract infection, site not specified: N39.0

## 2018-12-21 ENCOUNTER — Other Ambulatory Visit: Payer: Self-pay | Admitting: Urology

## 2018-12-23 ENCOUNTER — Other Ambulatory Visit: Payer: Self-pay

## 2018-12-23 ENCOUNTER — Encounter (INDEPENDENT_AMBULATORY_CARE_PROVIDER_SITE_OTHER): Payer: Self-pay

## 2018-12-23 ENCOUNTER — Encounter (HOSPITAL_COMMUNITY)
Admission: RE | Admit: 2018-12-23 | Discharge: 2018-12-23 | Disposition: A | Discharge status: Home / Self Care | Payer: Medicare Other | Source: Ambulatory Visit | Attending: Urology | Admitting: Urology

## 2018-12-23 ENCOUNTER — Encounter (HOSPITAL_COMMUNITY): Payer: Self-pay

## 2018-12-23 DIAGNOSIS — Z01812 Encounter for preprocedural laboratory examination: Secondary | ICD-10-CM | POA: Insufficient documentation

## 2018-12-23 LAB — CBC
HCT: 40.5 % (ref 39.0–52.0)
HEMOGLOBIN: 13.1 g/dL (ref 13.0–17.0)
MCH: 31.5 pg (ref 26.0–34.0)
MCHC: 32.3 g/dL (ref 30.0–36.0)
MCV: 97.4 fL (ref 80.0–100.0)
PLATELETS: 204 10*3/uL (ref 150–400)
RBC: 4.16 MIL/uL — AB (ref 4.22–5.81)
RDW: 13.3 % (ref 11.5–15.5)
WBC: 4.9 10*3/uL (ref 4.0–10.5)
nRBC: 0 % (ref 0.0–0.2)

## 2018-12-23 NOTE — Progress Notes (Signed)
05/25/2018- noted in [place]

## 2018-12-23 NOTE — Patient Instructions (Addendum)
Michael Burch  12/23/2018   Your procedure is scheduled on: 12-27-2018   Report to [facility] Main  Entrance    Report to Admitting at 9:30 AM    Call this number if you have problems the morning of surgery [phone]    Remember: Do not eat food or drink liquids :After Midnight.    BRUSH YOUR TEETH MORNING OF SURGERY AND RINSE YOUR MOUTH OUT, NO CHEWING GUM CANDY OR MINTS.     Take these medicines the morning of surgery with A SIP OF WATER: None                                You may not have any metal on your body including hair pins and              piercings  Do not wear jewelry, make-up, lotions, powders or perfumes, deodorant             Do not wear nail polish.  Do not shave  48 hours prior to surgery.             Do not bring valuables to the hospital. [place].  Contacts, dentures or bridgework may not be worn into surgery.  Leave suitcase in the car. After surgery it may be brought to your room.                   Please read over the following fact sheets you were given: _____________________________________________________________________             [facility] - Preparing for Surgery Before surgery, you can play an important role.  Because skin is not sterile, your skin needs to be as free of germs as possible.  You can reduce the number of germs on your skin by washing with CHG (chlorahexidine gluconate) soap before surgery.  CHG is an antiseptic cleaner which kills germs and bonds with the skin to continue killing germs even after washing. Please DO NOT use if you have an allergy to CHG or antibacterial soaps.  If your skin becomes reddened/irritated stop using the CHG and inform your nurse when you arrive at Short Stay. Do not shave (including legs and underarms) for at least 48 hours prior to the first CHG shower.  You may shave your face/neck. Please follow these instructions  carefully:  1.  Shower with CHG Soap the night before surgery and the  morning of Surgery.  2.  If you choose to wash your hair, wash your hair first as usual with your  normal  shampoo.  3.  After you shampoo, rinse your hair and body thoroughly to remove the  shampoo.                           4.  Use CHG as you would any other liquid soap.  You can apply chg directly  to the skin and wash                       Gently with a scrungie or clean washcloth.  5.  Apply the CHG Soap to your body ONLY FROM THE NECK DOWN.  Do not use on face/ open                           Wound or open sores. Avoid contact with eyes, ears mouth and genitals (private parts).                       Wash face,  Genitals (private parts) with your normal soap.             6.  Wash thoroughly, paying special attention to the area where your surgery  will be performed.  7.  Thoroughly rinse your body with warm water from the neck down.  8.  DO NOT shower/wash with your normal soap after using and rinsing off  the CHG Soap.                9.  Pat yourself dry with a clean towel.            10.  Wear clean pajamas.            11.  Place clean sheets on your bed the night of your first shower and do not  sleep with pets. Day of Surgery : Do not apply any lotions/deodorants the morning of surgery.  Please wear clean clothes to the hospital/surgery center.  FAILURE TO FOLLOW THESE INSTRUCTIONS MAY RESULT IN THE CANCELLATION OF YOUR SURGERY PATIENT SIGNATURE_________________________________  NURSE SIGNATURE__________________________________  ________________________________________________________________________

## 2018-12-26 MED ORDER — GENTAMICIN SULFATE 40 MG/ML IJ SOLN
5.0000 mg/kg | INTRAVENOUS | Status: AC
  Administered 2018-12-27: 270 mg via INTRAVENOUS
  Filled 2018-12-26: qty 6.75
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
## 2018-12-27 ENCOUNTER — Encounter (HOSPITAL_COMMUNITY): Payer: Self-pay | Admitting: *Deleted

## 2018-12-27 ENCOUNTER — Ambulatory Visit (HOSPITAL_COMMUNITY): Payer: Medicare Other | Admitting: Anesthesiology

## 2018-12-27 ENCOUNTER — Encounter (HOSPITAL_COMMUNITY)
Admission: RE | Disposition: A | Discharge status: Home / Self Care | Payer: Self-pay | Source: Home / Self Care | Attending: Urology

## 2018-12-27 ENCOUNTER — Ambulatory Visit (HOSPITAL_COMMUNITY): Payer: Medicare Other | Admitting: Physician Assistant

## 2018-12-27 ENCOUNTER — Observation Stay (HOSPITAL_COMMUNITY)
Admission: RE | Admit: 2018-12-27 | Discharge: 2018-12-28 | Disposition: A | Discharge status: Home / Self Care | Payer: Medicare Other | Attending: Urology | Admitting: Urology

## 2018-12-27 ENCOUNTER — Other Ambulatory Visit: Payer: Self-pay

## 2018-12-27 DIAGNOSIS — N401 Enlarged prostate with lower urinary tract symptoms: Secondary | ICD-10-CM | POA: Diagnosis present

## 2018-12-27 DIAGNOSIS — R300 Dysuria: Secondary | ICD-10-CM | POA: Diagnosis not present

## 2018-12-27 DIAGNOSIS — N138 Other obstructive and reflux uropathy: Secondary | ICD-10-CM | POA: Diagnosis present

## 2018-12-27 DIAGNOSIS — R3915 Urgency of urination: Secondary | ICD-10-CM | POA: Insufficient documentation

## 2018-12-27 DIAGNOSIS — R330 Drug induced retention of urine: Principal | ICD-10-CM | POA: Insufficient documentation

## 2018-12-27 DIAGNOSIS — R350 Frequency of micturition: Secondary | ICD-10-CM | POA: Insufficient documentation

## 2018-12-27 DIAGNOSIS — R338 Other retention of urine: Secondary | ICD-10-CM | POA: Insufficient documentation

## 2018-12-27 DIAGNOSIS — Z96652 Presence of left artificial knee joint: Secondary | ICD-10-CM | POA: Insufficient documentation

## 2018-12-27 DIAGNOSIS — Z791 Long term (current) use of non-steroidal anti-inflammatories (NSAID): Secondary | ICD-10-CM | POA: Diagnosis not present

## 2018-12-27 DIAGNOSIS — T485X5A Adverse effect of other anti-common-cold drugs, initial encounter: Secondary | ICD-10-CM | POA: Diagnosis not present

## 2018-12-27 DIAGNOSIS — Z8042 Family history of malignant neoplasm of prostate: Secondary | ICD-10-CM | POA: Diagnosis not present

## 2018-12-27 DIAGNOSIS — Z79899 Other long term (current) drug therapy: Secondary | ICD-10-CM | POA: Insufficient documentation

## 2018-12-27 HISTORY — PX: TRANSURETHRAL RESECTION OF PROSTATE: SHX73

## 2018-12-27 HISTORY — DX: Urinary tract infection, site not specified: N39.0

## 2018-12-27 SURGERY — TURP (TRANSURETHRAL RESECTION OF PROSTATE)
Anesthesia: General

## 2018-12-27 MED ORDER — ROCURONIUM BROMIDE 100 MG/10ML IV SOLN
INTRAVENOUS | Status: DC | PRN
  Administered 2018-12-27: 20 mg via INTRAVENOUS

## 2018-12-27 MED ORDER — FLEET ENEMA 7-19 GM/118ML RE ENEM: 1.0000 | ENEMA | Freq: Once | RECTAL | Status: DC | PRN

## 2018-12-27 MED ORDER — DOCUSATE SODIUM 100 MG PO CAPS: 100.0000 mg | ORAL_CAPSULE | Freq: Two times a day (BID) | ORAL | 2 refills | Status: DC

## 2018-12-27 MED ORDER — OXYCODONE HCL 5 MG/5ML PO SOLN: 5.0000 mg | Freq: Once | ORAL | Status: DC | PRN

## 2018-12-27 MED ORDER — LIDOCAINE 2% (20 MG/ML) 5 ML SYRINGE
INTRAMUSCULAR | Status: DC | PRN
  Administered 2018-12-27: 60 mg via INTRAVENOUS

## 2018-12-27 MED ORDER — OXYCODONE HCL 5 MG PO TABS: 5.0000 mg | ORAL_TABLET | Freq: Once | ORAL | Status: DC | PRN

## 2018-12-27 MED ORDER — ONDANSETRON HCL 4 MG/2ML IJ SOLN
INTRAMUSCULAR | Status: DC | PRN
  Administered 2018-12-27: 4 mg via INTRAVENOUS

## 2018-12-27 MED ORDER — FENTANYL CITRATE (PF) 100 MCG/2ML IJ SOLN
INTRAMUSCULAR | Status: AC
  Filled 2018-12-27: qty 2

## 2018-12-27 MED ORDER — HYDROCODONE-ACETAMINOPHEN 5-325 MG PO TABS: 1.0000 | ORAL_TABLET | ORAL | Status: DC | PRN

## 2018-12-27 MED ORDER — PROPOFOL 10 MG/ML IV BOLUS
INTRAVENOUS | Status: DC | PRN
  Administered 2018-12-27: 100 mg via INTRAVENOUS
  Administered 2018-12-27: 50 mg via INTRAVENOUS

## 2018-12-27 MED ORDER — ONDANSETRON HCL 4 MG/2ML IJ SOLN: 4.0000 mg | Freq: Once | INTRAMUSCULAR | Status: DC | PRN

## 2018-12-27 MED ORDER — SODIUM CHLORIDE 0.9 % IR SOLN
Status: DC | PRN
  Administered 2018-12-27: 9000 mL

## 2018-12-27 MED ORDER — SODIUM CHLORIDE 0.9 % IR SOLN: 3000.0000 mL | Status: DC

## 2018-12-27 MED ORDER — FENTANYL CITRATE (PF) 100 MCG/2ML IJ SOLN: 25.0000 ug | INTRAMUSCULAR | Status: DC | PRN

## 2018-12-27 MED ORDER — SUGAMMADEX SODIUM 200 MG/2ML IV SOLN
INTRAVENOUS | Status: AC
  Filled 2018-12-27: qty 2

## 2018-12-27 MED ORDER — ACETAMINOPHEN 325 MG PO TABS: 650.0000 mg | ORAL_TABLET | ORAL | Status: DC | PRN

## 2018-12-27 MED ORDER — BISACODYL 10 MG RE SUPP: 10.0000 mg | Freq: Every day | RECTAL | Status: DC | PRN

## 2018-12-27 MED ORDER — SODIUM CHLORIDE 0.9 % IV SOLN
1.0000 g | INTRAVENOUS | Status: AC
  Administered 2018-12-27: 2 g via INTRAVENOUS
  Filled 2018-12-27: qty 1

## 2018-12-27 MED ORDER — HYDROMORPHONE HCL 1 MG/ML IJ SOLN: 0.5000 mg | INTRAMUSCULAR | Status: DC | PRN

## 2018-12-27 MED ORDER — DEXAMETHASONE SODIUM PHOSPHATE 10 MG/ML IJ SOLN
INTRAMUSCULAR | Status: AC
  Filled 2018-12-27: qty 1

## 2018-12-27 MED ORDER — ROCURONIUM BROMIDE 100 MG/10ML IV SOLN
INTRAVENOUS | Status: AC
  Filled 2018-12-27: qty 1

## 2018-12-27 MED ORDER — LACTATED RINGERS IV SOLN
INTRAVENOUS | Status: DC | PRN
  Administered 2018-12-27: 11:00:00 via INTRAVENOUS

## 2018-12-27 MED ORDER — PROPOFOL 10 MG/ML IV BOLUS
INTRAVENOUS | Status: AC
  Filled 2018-12-27: qty 20

## 2018-12-27 MED ORDER — ONDANSETRON HCL 4 MG/2ML IJ SOLN
INTRAMUSCULAR | Status: AC
  Filled 2018-12-27: qty 2

## 2018-12-27 MED ORDER — ONDANSETRON HCL 4 MG/2ML IJ SOLN: 4.0000 mg | INTRAMUSCULAR | Status: DC | PRN

## 2018-12-27 MED ORDER — SUGAMMADEX SODIUM 200 MG/2ML IV SOLN
INTRAVENOUS | Status: DC | PRN
  Administered 2018-12-27: 200 mg via INTRAVENOUS

## 2018-12-27 MED ORDER — SENNOSIDES-DOCUSATE SODIUM 8.6-50 MG PO TABS: 1.0000 | ORAL_TABLET | Freq: Every evening | ORAL | Status: DC | PRN

## 2018-12-27 MED ORDER — FENTANYL CITRATE (PF) 100 MCG/2ML IJ SOLN
INTRAMUSCULAR | Status: DC | PRN
  Administered 2018-12-27 (×4): 25 ug via INTRAVENOUS

## 2018-12-27 MED ORDER — DEXAMETHASONE SODIUM PHOSPHATE 10 MG/ML IJ SOLN
INTRAMUSCULAR | Status: DC | PRN
  Administered 2018-12-27: 5 mg via INTRAVENOUS

## 2018-12-27 MED ORDER — LACTATED RINGERS IV SOLN
INTRAVENOUS | Status: DC
  Administered 2018-12-27: 11:00:00 via INTRAVENOUS

## 2018-12-27 MED ORDER — POTASSIUM CHLORIDE IN NACL 20-0.45 MEQ/L-% IV SOLN
INTRAVENOUS | Status: DC
  Administered 2018-12-27 – 2018-12-28 (×2): via INTRAVENOUS
  Filled 2018-12-27 (×3): qty 1000

## 2018-12-27 SURGICAL SUPPLY — 16 items
BAG URINE DRAINAGE (UROLOGICAL SUPPLIES) ×1 IMPLANT
BAG URO CATCHER STRL LF (MISCELLANEOUS) ×2 IMPLANT
CATH FOLEY 3WAY 30CC 22FR (CATHETERS) ×1 IMPLANT
COVER WAND RF STERILE (DRAPES) IMPLANT
ELECT REM PT RETURN 15FT ADLT (MISCELLANEOUS) ×2 IMPLANT
GLOVE SURG SS PI 8.0 STRL IVOR (GLOVE) ×1 IMPLANT
GOWN STRL REUS W/TWL XL LVL3 (GOWN DISPOSABLE) ×2 IMPLANT
HOLDER FOLEY CATH W/STRAP (MISCELLANEOUS) ×1 IMPLANT
LOOP CUT BIPOLAR 24F LRG (ELECTROSURGICAL) ×1 IMPLANT
MANIFOLD NEPTUNE II (INSTRUMENTS) ×2 IMPLANT
PACK CYSTO (CUSTOM PROCEDURE TRAY) ×2 IMPLANT
SET ASPIRATION TUBING (TUBING) ×2 IMPLANT
SYR 30ML LL (SYRINGE) IMPLANT
SYRINGE IRR TOOMEY STRL 70CC (SYRINGE) IMPLANT
TUBING CONNECTING 10 (TUBING) ×2 IMPLANT
TUBING UROLOGY SET (TUBING) ×2 IMPLANT

## 2018-12-27 NOTE — Op Note (Signed)
Procedure: Transurethral resection of the prostate.  Preop diagnosis: BPH with retention.  Postop diagnosis: Same.  Surgeon: Dr. Irine Seal.  Anesthesia: General.  Complications: None.  Drains: 22 French three-way Foley catheter.  Specimen: Prostate chips.  EBL: Minimal.  Indications: The patient is a 78 year old male with a history of urinary retention following the use of a decongestant.  He is failed conservative therapy with medication and voiding trials and is to undergo TURP.  Procedure: He was given antibiotics.  A general anesthetic was induced.  He was placed in lithotomy position and fitted with PAS hose.  Care was taken with positioning because of his recent knee replacement.  His perineum and genitalia were prepped with Betadine solution and he was draped in usual sterile fashion.  The 33 French continuous-flow resectoscope sheath was placed with the aid of a visual obturator and a 30 degree lens.  Examination revealed a normal urethra.  The external sphincter was Intact.  The prostatic urethra was short with primarily high bladder neck and minimal lateral lobe enlargement.  Examination of bladder revealed moderate severe trabeculation with several cellules and small diverticuli.  Ureteral orifice ease were unremarkable.  The resectoscope was then fitted with an Beatrix Fetters handle with a bipolar loop and a 30 degree lens.  Saline was used as the irrigant.  The resection was initiated at the bladder neck where the fibers were exposed from 5-7 o'clock the tissue was noted to be a dense stromal tissue and this extended back to the verumontanum as the floor the prostate was resected out to alongside the verumontanum.  A small amount of lateral lobe tissue was resected as there was not much bulk in this area.  At this point I transitioned to a [employer].  I then performed bilateral transurethral incisions of the prostate with one incision at 5:00 and one at 7:00.  The incisions were  carried down to the fat at the bladder neck.  This greatly increased the diameter of the resection channel.  I then replaced the loop and resected the tissue between the 2 incisions  And some additional apical lateral lobe resection was performed as well. The bladder was evacuated free of chips.  Final hemostasis was achieved.  Inspection demonstrated intact ureteral orifice ease, no retained chips, No active bleeding, a widely patent resection channel and an intact external sphincter.  The scope was removed and pressure on the bladder produced an excellent stream.  A 22 French three-way Foley catheter was placed with the aid of a catheter guide.  The balloon was filled with 30 mL of sterile fluid and the catheter was placed to continuous irrigation and straight drainage after hand irrigation returned clear.  Patient was taken down from lithotomy position, his anesthetic was reversed and he was moved to recovery in stable condition.  There were no complications.

## 2018-12-27 NOTE — Anesthesia Postprocedure Evaluation (Signed)
Anesthesia Post Note  Patient: Michael Burch  Procedure(s) Performed: TRANSURETHRAL RESECTION OF THE PROSTATE (TURP) (N/A )     Patient location during evaluation: PACU Anesthesia Type: General Level of consciousness: awake and alert Pain management: pain level controlled Vital Signs Assessment: post-procedure vital signs reviewed and stable Respiratory status: spontaneous breathing, nonlabored ventilation, respiratory function stable and patient connected to nasal cannula oxygen Cardiovascular status: blood pressure returned to baseline and stable Postop Assessment: no apparent nausea or vomiting Anesthetic complications: no    Last Vitals:  Vitals:   12/27/18 1445 12/27/18 1518  BP: 132/88 128/82  Pulse: 68 76  Resp: 13 16  Temp:  36.6 C  SpO2: 100% 100%    Last Pain:  Vitals:   12/27/18 1518  TempSrc: Oral  PainSc:                  Tyleah Loh COKER

## 2018-12-27 NOTE — Discharge Instructions (Signed)
Indwelling Urinary Catheter Insertion, Care After This sheet gives you information about how to care for yourself after your procedure. Your health care provider may also give you more specific instructions. If you have problems or questions, contact your health care provider. What can I expect after the procedure? After the procedure, it is common to have:  Slight discomfort around your urethra where the catheter enters your body. Follow these instructions at home:   Keep the drainage bag at or below the level of your bladder. Doing this ensures that urine can only drain out, not back into your body.  Secure the catheter tubing and drainage bag to your leg or thigh to keep it from moving.  Check the catheter tubing regularly to make sure there are no kinks or blockages.  Take showers daily to keep the catheter clean. Do not take a bath.  Do not pull on your catheter or try to remove it.  Disconnect the tubing and drainage bag as little as possible.  Empty the drainage bag every 2-4 hours, or more often if needed. Do not let the bag get completely full.  Wash your hands with soap and water before and after touching the catheter, tubing, or drainage bag.  Do not let the drainage bag or catheter tubing touch the floor.  Drink enough fluids to keep your urine clear or pale yellow, or as told by your health care provider. Contact a health care provider if:  Urine stops flowing into the drainage bag.  You feel pain or pressure in the bladder area.  You have back pain.  Your catheter gets clogged.  Your catheter starts to leak.  Your urine looks cloudy.  Your drainage bag or tubing looks dirty.  You notice a bad smell when emptying your drainage bag. Get help right away if:  You have a fever or chills.  You have severe pain in your back or your lower abdomen.  You have warmth, redness, swelling, or pain in the urethra area.  You notice blood in your urine.  Your  catheter gets pulled out. Summary  Do not pull on your catheter or try to remove it.  Keep the drainage bag at or below the level of your bladder, but do not let the drainage bag or catheter tubing touch the floor.  Wash your hands with soap and water before and after touching the catheter, tubing, or drainage bag.  Contact your health care provider if you have a fever, chills, or any other signs of infection. This information is not intended to replace advice given to you by your health care provider. Make sure you discuss any questions you have with your health care provider. Document Released: 11/21/2016 Document Revised: 11/21/2016 Document Reviewed: 11/21/2016 Elsevier Interactive Patient Education  2019 [employer] Transurethral Resection of the Prostate, Care After Refer to this sheet in the next few weeks. These instructions provide you with information about caring for yourself after your procedure. Your health care provider may also give you more specific instructions. Your treatment has been planned according to current medical practices, but problems sometimes occur. Call your health care provider if you have any problems or questions after your procedure. What can I expect after the procedure? After the procedure, it is common to have:  Mild pain in your lower abdomen.  Soreness or mild discomfort in your penis from having the catheter inserted during the procedure.  A feeling of urgency when you need to urinate.  A small amount  of blood in your urine. You may notice some small blood clots in your urine. These are normal. Follow these instructions at home: Medicines   Take over-the-counter and prescription medicines only as told by your health care provider.  Do not drive or operate heavy machinery while taking prescription pain medicine.  Do not drive for 24 hours if you received a sedative.  If you were prescribed antibiotic medicine, take it as told by your  health care provider. Do not stop taking the antibiotic even if you start to feel better. Activity  Return to your normal activities as told by your health care provider. Ask your health care provider what activities are safe for you.  Do not lift anything that is heavier than 10 lb (4.5 kg) for 3 weeks after your procedure, or as long as told by your health care provider.  Avoid intense physical activity for as long as told by your health care provider.  Avoid sitting for a long time without moving. Get up and move around one or more times every few hours. This helps to prevent blood clots. You may increase your physical activity gradually as you start to feel better. Lifestyle  Do not drink alcohol for as long as told by your health care provider. This is especially important if you are taking prescription pain medicines.  Do not engage in sexual activity until your health care provider says that you can do this. General instructions  Do not take baths, swim, or use a hot tub until your health care provider approves.  Drink enough fluid to keep your urine clear or pale yellow.  Urinate as soon as you feel the need to. Do not try to hold your urine for long periods of time.  If your health care provider approves, you may take a stool softener for 2-3 weeks to prevent you from straining to have a bowel movement.  Wear compression stockings as told by your health care provider. These stockings help to prevent blood clots and reduce swelling in your legs.  Keep all follow-up visits as told by your health care provider. This is important. Contact a health care provider if:  You have difficulty urinating.  You have a fever.  You have pain that gets worse or does not improve with medicine.  You have blood in your urine that does not go away after 1 week of resting and drinking more fluids.  You have swelling in your penis or testicles. Get help right away if:  You are unable to  urinate.  You are having more blood clots in your urine instead of fewer.  You have: ? Large blood clots. ? A lot of blood in your urine. ? Pain in your back or lower abdomen. ? Pain or swelling in your legs. ? Chills and you are shaking. This information is not intended to replace advice given to you by your health care provider. Make sure you discuss any questions you have with your health care provider. Document Released: 10/12/2005 Document Revised: 11/25/2016 Document Reviewed: 07/04/2015 Elsevier Interactive Patient Education  2019 [employer].

## 2018-12-27 NOTE — Interval H&P Note (Signed)
History and Physical Interval Note:  12/27/2018 11:19 AM  Michael Burch  has presented today for surgery, with the diagnosis of BENIGN PROSTATE HYPERPLASIA WITH RETENTION  The various methods of treatment have been discussed with the patient and family. After consideration of risks, benefits and other options for treatment, the patient has consented to  Procedure(s): TRANSURETHRAL RESECTION OF THE PROSTATE (TURP) (N/A) as a surgical intervention .  The patient's history has been reviewed, patient examined, no change in status, stable for surgery.  I have reviewed the patient's chart and labs.  Questions were answered to the patient's satisfaction.     Irine Seal

## 2018-12-27 NOTE — Anesthesia Procedure Notes (Signed)
Procedure Name: Intubation Date/Time: 12/27/2018 11:41 AM Performed by: Victoriano Lain, CRNA Pre-anesthesia Checklist: Patient identified, Emergency Drugs available, Suction available, Patient being monitored and Timeout performed Patient Re-evaluated:Patient Re-evaluated prior to induction Oxygen Delivery Method: Circle system utilized Preoxygenation: Pre-oxygenation with 100% oxygen Induction Type: IV induction Ventilation: Mask ventilation without difficulty Laryngoscope Size: Mac and 4 Grade View: Grade I Tube type: Oral Tube size: 7.5 mm Number of attempts: 1 Airway Equipment and Method: Stylet Placement Confirmation: ETT inserted through vocal cords under direct vision,  positive ETCO2 and breath sounds checked- equal and bilateral Secured at: 21 cm Tube secured with: Tape Dental Injury: Teeth and Oropharynx as per pre-operative assessment

## 2018-12-27 NOTE — Transfer of Care (Signed)
Immediate Anesthesia Transfer of Care Note  Patient: Michael Burch  Procedure(s) Performed: TRANSURETHRAL RESECTION OF THE PROSTATE (TURP) (N/A )  Patient Location: PACU  Anesthesia Type:General  Level of Consciousness: awake and alert   Airway & Oxygen Therapy: Patient Spontanous Breathing and Patient connected to face mask oxygen  Post-op Assessment: Report given to RN and Post -op Vital signs reviewed and stable  Post vital signs: Reviewed and stable  Last Vitals:  Vitals Value Taken Time  BP 106/82 12/27/2018 12:28 PM  Temp    Pulse 70 12/27/2018 12:29 PM  Resp 16 12/27/2018 12:29 PM  SpO2 100 % 12/27/2018 12:29 PM  Vitals shown include unvalidated device data.  Last Pain:  Vitals:   12/27/18 0955  TempSrc: Oral  PainSc: 0-No pain      Patients Stated Pain Goal: 5 (97/74/14 2395)  Complications: No apparent anesthesia complications

## 2018-12-27 NOTE — Anesthesia Preprocedure Evaluation (Addendum)
Anesthesia Evaluation  Patient identified by MRN, date of birth, ID band Patient awake    Reviewed: Allergy & Precautions, NPO status , Patient's Chart, lab work & pertinent test results  Airway Mallampati: II  TM Distance: >3 FB Neck ROM: Full    Dental  (+) Teeth Intact, Dental Advisory Given   Pulmonary    breath sounds clear to auscultation       Cardiovascular  Rhythm:Regular Rate:Normal     Neuro/Psych    GI/Hepatic   Endo/Other    Renal/GU      Musculoskeletal   Abdominal   Peds  Hematology   Anesthesia Other Findings   Reproductive/Obstetrics                            Anesthesia Physical Anesthesia Plan  ASA: II  Anesthesia Plan: General   Post-op Pain Management:    Induction: Intravenous  PONV Risk Score and Plan: 0 and Ondansetron and Dexamethasone  Airway Management Planned: LMA  Additional Equipment:   Intra-op Plan:   Post-operative Plan:   Informed Consent: I have reviewed the patients History and Physical, chart, labs and discussed the procedure including the risks, benefits and alternatives for the proposed anesthesia with the patient or authorized representative who has indicated his/her understanding and acceptance.     Dental advisory given  Plan Discussed with: CRNA and Anesthesiologist  Anesthesia Plan Comments:         Anesthesia Quick Evaluation

## 2018-12-27 NOTE — Progress Notes (Signed)
Patient ID: Michael Burch, male   DOB: 11-27-1940, 78 y.o.   MRN: [id number]  Doing well without complaints.  Urine clear.   .BP 128/82 (BP Location: Right Arm)   Pulse 76   Temp 97.9 F (36.6 C) (Oral)   Resp 16   Ht 5\' 2"  (1.575 m)   Wt 55.1 kg   SpO2 100%   BMI 22.22 kg/m    Will stop CBI.  Plan for DC with foley in AM.

## 2018-12-27 NOTE — Anesthesia Procedure Notes (Signed)
Procedure Name: LMA Insertion Date/Time: 12/27/2018 11:36 AM Performed by: Victoriano Lain, CRNA Pre-anesthesia Checklist: Patient identified, Emergency Drugs available, Suction available, Patient being monitored and Timeout performed Patient Re-evaluated:Patient Re-evaluated prior to induction Oxygen Delivery Method: Circle system utilized Preoxygenation: Pre-oxygenation with 100% oxygen Induction Type: IV induction LMA: LMA inserted LMA Size: 4.0 Number of attempts: 1 Placement Confirmation: positive ETCO2 and breath sounds checked- equal and bilateral Tube secured with: Tape Dental Injury: Teeth and Oropharynx as per pre-operative assessment

## 2018-12-28 ENCOUNTER — Encounter (HOSPITAL_COMMUNITY): Payer: Self-pay | Admitting: Urology

## 2018-12-28 DIAGNOSIS — R330 Drug induced retention of urine: Secondary | ICD-10-CM | POA: Diagnosis not present

## 2018-12-28 MED ORDER — CEFDINIR 300 MG PO CAPS: 300.0000 mg | ORAL_CAPSULE | Freq: Two times a day (BID) | ORAL | 0 refills | Status: DC

## 2018-12-28 NOTE — Discharge Summary (Signed)
Physician Discharge Summary  Patient ID: Michael Burch MRN: [id number] DOB/AGE: 06-09-1941 78 y.o.  Admit date: 12/27/2018 Discharge date: 12/28/2018  Admission Diagnoses:  BPH with obstruction/lower urinary tract symptoms  Discharge Diagnoses:  Principal Problem:   BPH with obstruction/lower urinary tract symptoms   Past Medical History:  Diagnosis Date  . Arthritis   . H/O echocardiogram 02/18/2010   normal lv systolic, mildly impaired LV relaxation,trace MR,Trace TR  . Hyperlipidemia   . UTI (urinary tract infection) 11/2018    Surgeries: Procedure(s): TRANSURETHRAL RESECTION OF THE PROSTATE (TURP) on 12/27/2018   Consultants (if any):   Discharged Condition: Improved  Hospital Course: Michael Burch is an 78 y.o. male who was admitted 12/27/2018 with a diagnosis of BPH with obstruction/lower urinary tract symptoms and went to the operating room on 12/27/2018 and underwent the above named procedures.  He did well and was discharged home with the foley on 3/4.  The urine was clear.   He was given perioperative antibiotics:  Anti-infectives (From admission, onward)   Start     Dose/Rate Route Frequency Ordered Stop   12/28/18 0000  cefdinir (OMNICEF) 300 MG capsule     300 mg Oral 2 times daily 12/28/18 0756     12/27/18 0944  cefTRIAXone (ROCEPHIN) 1 g in sodium chloride 0.9 % 100 mL IVPB     1 g 200 mL/hr over 30 Minutes Intravenous 30 min pre-op 12/27/18 0944 12/27/18 1157   12/27/18 0600  gentamicin (GARAMYCIN) 270 mg in dextrose 5 % 100 mL IVPB     5 mg/kg  54.4 kg 106.8 mL/hr over 60 Minutes Intravenous 30 min pre-op 12/26/18 1037 12/27/18 1225    .  He was given sequential compression devices  for DVT prophylaxis.  He benefited maximally from the hospital stay and there were no complications.    Recent vital signs:  Vitals:   12/28/18 0112 12/28/18 0458  BP: 125/73 111/62  Pulse: 63 (!) 54  Resp: 16 20  Temp: 98.3 F (36.8 C)   SpO2: 97% 96%     Recent laboratory studies:  Lab Results  Component Value Date   HGB 13.1 12/23/2018   HGB 12.7 (L) 06/27/2018   HGB 12.9 (L) 06/26/2018   Lab Results  Component Value Date   WBC 4.9 12/23/2018   PLT 204 12/23/2018   No results found for: INR Lab Results  Component Value Date   NA 138 06/25/2018   K 3.9 06/25/2018   CL 104 06/25/2018   CO2 29 06/25/2018   BUN 12 06/25/2018   CREATININE 1.01 06/25/2018   GLUCOSE 130 (H) 06/25/2018    Discharge Medications:   Allergies as of 12/28/2018   No Known Allergies     Medication List    TAKE these medications   cefdinir 300 MG capsule Commonly known as:  OMNICEF Take 1 capsule (300 mg total) by mouth 2 (two) times daily.   docusate sodium 100 MG capsule Commonly known as:  COLACE Take 1 capsule (100 mg total) by mouth 2 (two) times daily.       Diagnostic Studies: No results found.  Disposition: Discharge disposition: 01-Home or Self Care       Discharge Instructions    Discharge patient   Complete by:  As directed    Discharge disposition:  01-Home or Self Care   Discharge patient date:  12/28/2018   Discontinue IV   Complete by:  As directed    Urinary leg bag   Complete  by:  As directed       Follow-up Information    Karen Kays, NP Follow up on 12/30/2018.   Specialty:  Nurse Practitioner Why:  9:15am Contact information: [address] [place] [address] [phone]            Signed: Irine Seal 12/28/2018, 7:57 AM

## 2018-12-28 NOTE — Plan of Care (Signed)
Pt was able to have a BM this shift and has good urine OP. Pt denies pain.

## 2018-12-28 NOTE — Plan of Care (Signed)
Pt was able to have a BM this shift. Pt has had good urine OP this shift and has denied pain.

## 2019-05-31 ENCOUNTER — Ambulatory Visit (INDEPENDENT_AMBULATORY_CARE_PROVIDER_SITE_OTHER): Payer: Medicare Other | Admitting: Cardiology

## 2019-05-31 ENCOUNTER — Other Ambulatory Visit: Payer: Self-pay

## 2019-05-31 ENCOUNTER — Encounter: Payer: Self-pay | Admitting: Cardiology

## 2019-05-31 VITALS — BP 113/79 | HR 66 | Ht 64.0 in | Wt 122.7 lb

## 2019-05-31 DIAGNOSIS — Z0000 Encounter for general adult medical examination without abnormal findings: Secondary | ICD-10-CM

## 2019-05-31 DIAGNOSIS — Z136 Encounter for screening for cardiovascular disorders: Secondary | ICD-10-CM
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
## 2019-08-18 ENCOUNTER — Encounter (HOSPITAL_COMMUNITY): Payer: Self-pay

## 2019-08-18 NOTE — Patient Instructions (Addendum)
DUE TO COVID-19 ONLY ONE VISITOR IS ALLOWED TO COME WITH YOU AND STAY IN THE WAITING ROOM ONLY DURING PRE OP AND PROCEDURE. THE ONE VISITOR MAY VISIT WITH YOU IN YOUR PRIVATE ROOM DURING VISITING HOURS ONLY!!   COVID SWAB TESTING MUST BE COMPLETED ON: Tuesday, Oct. 27, 2020 at    11:05 [address] [place] -[facility] enter pre surgical testing line (Must self quarantine after testing. Follow instructions on handout.)             Your procedure is scheduled on: Friday, Oct. 30, 2020   Report to [facility] Main  Entrance    Report to admitting at 7:45 AM   Call this number if you have problems the morning of surgery [phone]   Do not eat food :After Midnight.   May have liquids until 7:15 AM day of surgery   CLEAR LIQUID DIET  Foods Allowed                                                                     Foods Excluded  Water, Black Coffee and tea, regular and decaf                             liquids that you cannot  Plain Jell-O in any flavor  (No red)                                           see through such as: Fruit ices (not with fruit pulp)                                     milk, soups, orange juice  Iced Popsicles (No red)                                    All solid food Carbonated beverages, regular and diet                                    Apple juices Sports drinks like Gatorade (No red) Lightly seasoned clear broth or consume(fat free) Sugar, honey syrup     Complete one Ensure drink the morning of surgery at 7:15AM the day of surgery.   Brush your teeth the morning of surgery.   Do NOT smoke after Midnight   Take these medicines the morning of surgery with A SIP OF WATER: None                               You may not have any metal on your body including jewelry, and body piercings             Do not wear lotions, powders, /cologne, or deodorant  Men may shave face and neck.   Do  not bring valuables to the hospital. [place].   Contacts, dentures or bridgework may not be worn into surgery.   Bring small overnight bag day of surgery.    Special Instructions: Bring a copy of your healthcare power of attorney and living will documents  the day of surgery if you haven't scanned them in before.              Please read over the following fact sheets you were given:  [facility] - Preparing for Surgery  Before surgery, you can play an important role.   Because skin is not sterile, your skin needs to be as free of germs as possible .  You can reduce the number of germs on your skin by washing with CHG (chlorahexidine gluconate) soap before surgery.   CHG is an antiseptic cleaner which kills germs and bonds with the skin to continue killing germs even after washing. Please DO NOT use if you have an allergy to CHG or antibacterial soaps.   If your skin becomes reddened/irritated stop using the CHG and inform your nurse when you arrive at Short Stay. .  You may shave your face/neck.  Please follow these instructions carefully:  1.  Shower with CHG Soap the night before surgery and the  morning of surgery.  2.  If you choose to wash your hair, wash your hair first as usual with your normal  shampoo.  3.  After you shampoo, rinse your hair and body thoroughly to remove the shampoo.                             4.  Use CHG as you would any other liquid soap.  You can apply chg directly to the skin and wash.                  Gently with a scrungie or clean washcloth.  5.  Apply the CHG Soap to your body ONLY FROM THE NECK DOWN.                   Do not use on face/ open Wound or open sores.                  Avoid contact with eyes, ears mouth and genitals (private parts).                       Wash face,  Genitals (private parts) with your normal soap.             6.  Wash thoroughly, paying special attention to the area where  your surgery  will be performed.  7.  Thoroughly rinse your body with warm water from the neck down.  8.  DO NOT shower/wash with your normal soap after using and rinsing off the CHG Soap.             9.  Pat yourself dry with a clean towel.            10.  Wear clean pajamas.            11.  Place clean sheets on your bed the night of your first shower and do not  sleep with pets. Day of Surgery :  Do not apply any lotions/deodorants the morning of surgery.  Please wear clean clothes to the hospital/surgery center.  FAILURE TO FOLLOW THESE INSTRUCTIONS MAY RESULT IN THE CANCELLATION OF YOUR SURGERY  PATIENT SIGNATURE_________________________________  NURSE SIGNATURE__________________________________  ________________________________________________________________________   Adam Phenix  An incentive spirometer is a tool that can help keep your lungs clear and active. This tool measures how well you are filling your lungs with each breath. Taking long deep breaths may help reverse or decrease the chance of developing breathing (pulmonary) problems (especially infection) following:  A long period of time when you are unable to move or be active. BEFORE THE PROCEDURE   If the spirometer includes an indicator to show your best effort, your nurse or respiratory therapist will set it to a desired goal.  If possible, sit up straight or lean slightly forward. Try not to slouch.  Hold the incentive spirometer in an upright position. INSTRUCTIONS FOR USE  1. Sit on the edge of your bed if possible, or sit up as far as you can in bed or on a chair. 2. Hold the incentive spirometer in an upright position. 3. Breathe out normally. 4. Place the mouthpiece in your mouth and seal your lips tightly around it. 5. Breathe in slowly and as deeply as possible, raising the piston or the ball toward the top of the column. 6. Hold your breath for 3-5 seconds or for as long as possible. Allow the  piston or ball to fall to the bottom of the column. 7. Remove the mouthpiece from your mouth and breathe out normally. 8. Rest for a few seconds and repeat Steps 1 through 7 at least 10 times every 1-2 hours when you are awake. Take your time and take a few normal breaths between deep breaths. 9. The spirometer may include an indicator to show your best effort. Use the indicator as a goal to work toward during each repetition. 10. After each set of 10 deep breaths, practice coughing to be sure your lungs are clear. If you have an incision (the cut made at the time of surgery), support your incision when coughing by placing a pillow or rolled up towels firmly against it. Once you are able to get out of bed, walk around indoors and cough well. You may stop using the incentive spirometer when instructed by your caregiver.  RISKS AND COMPLICATIONS  Take your time so you do not get dizzy or light-headed.  If you are in pain, you may need to take or ask for pain medication before doing incentive spirometry. It is harder to take a deep breath if you are having pain. AFTER USE  Rest and breathe slowly and easily.  It can be helpful to keep track of a log of your progress. Your caregiver can provide you with a simple table to help with this. If you are using the spirometer at home, follow these instructions: [place] IF:   You are having difficultly using the spirometer.  You have trouble using the spirometer as often as instructed.  Your pain medication is not giving enough relief while using the spirometer.  You develop fever of 100.5 F (38.1 C) or higher. SEEK IMMEDIATE MEDICAL CARE IF:   You cough up bloody sputum that had not been present before.  You develop fever of 102 F (38.9 C) or greater.  You develop worsening pain at or near the incision site. MAKE SURE YOU:   Understand these instructions.  Will watch your  condition.  Will get help right away if you are not  doing well or get worse. Document Released: 02/22/2007 Document Revised: 01/04/2012 Document Reviewed: 04/25/2007 [facility] Patient Information 2014 [place], [place].   ________________________________________________________________________

## 2019-08-21 ENCOUNTER — Encounter (HOSPITAL_COMMUNITY)
Admission: RE | Admit: 2019-08-21 | Discharge: 2019-08-21 | Disposition: A | Discharge status: Home / Self Care | Payer: Medicare Other | Source: Ambulatory Visit | Attending: Orthopedic Surgery | Admitting: Orthopedic Surgery

## 2019-08-21 ENCOUNTER — Encounter (INDEPENDENT_AMBULATORY_CARE_PROVIDER_SITE_OTHER): Payer: Self-pay

## 2019-08-21 ENCOUNTER — Encounter (HOSPITAL_COMMUNITY): Payer: Self-pay

## 2019-08-21 ENCOUNTER — Other Ambulatory Visit: Payer: Self-pay

## 2019-08-21 DIAGNOSIS — Z01812 Encounter for preprocedural laboratory examination: Secondary | ICD-10-CM | POA: Diagnosis present

## 2019-08-21 HISTORY — DX: Personal history of other diseases of male genital organs: Z87.438

## 2019-08-21 HISTORY — DX: Unspecified hearing loss, bilateral: H91.93

## 2019-08-21 HISTORY — DX: Unspecified osteoarthritis, unspecified site: M19.90

## 2019-08-21 LAB — CBC
HCT: 41.7 % (ref 39.0–52.0)
Hemoglobin: 14 g/dL (ref 13.0–17.0)
MCH: 32.2 pg (ref 26.0–34.0)
MCHC: 33.6 g/dL (ref 30.0–36.0)
MCV: 95.9 fL (ref 80.0–100.0)
Platelets: 173 10*3/uL (ref 150–400)
RBC: 4.35 MIL/uL (ref 4.22–5.81)
RDW: 12.4 % (ref 11.5–15.5)
WBC: 6.2 10*3/uL (ref 4.0–10.5)
nRBC: 0 % (ref 0.0–0.2)

## 2019-08-21 LAB — SURGICAL PCR SCREEN
MRSA, PCR: NEGATIVE
Staphylococcus aureus: POSITIVE — AB

## 2019-08-21 NOTE — Progress Notes (Signed)
PCP - Dr. Oneida Arenas Cardiologist - Dr. Christen Butter  Chest x-ray - 11/08/17 EKG -06/04/19  Stress Test - no ECHO - no Cardiac Cath - no  Sleep Study - No CPAP -   Fasting Blood Sugar -NA  Checks Blood Sugar _____ times a day  Blood Thinner Instructions:NA Aspirin Instructions: Last Dose:  Anesthesia review:   Patient denies shortness of breath, fever, cough and chest pain at PAT appointment yes  Patient verbalized understanding of instructions that were given to them at the PAT appointment. Patient was also instructed that they will need to review over the PAT instructions again at home before surgery. yes

## 2019-08-22 ENCOUNTER — Other Ambulatory Visit (HOSPITAL_COMMUNITY)
Admission: RE | Admit: 2019-08-22 | Discharge: 2019-08-22 | Disposition: A | Discharge status: Home / Self Care | Payer: Medicare Other | Source: Ambulatory Visit | Attending: Orthopedic Surgery | Admitting: Orthopedic Surgery

## 2019-08-22 DIAGNOSIS — Z01812 Encounter for preprocedural laboratory examination: Secondary | ICD-10-CM | POA: Insufficient documentation

## 2019-08-22 DIAGNOSIS — Z20828 Contact with and (suspected) exposure to other viral communicable diseases: Secondary | ICD-10-CM | POA: Insufficient documentation

## 2019-08-23 LAB — NOVEL CORONAVIRUS, NAA (HOSP ORDER, SEND-OUT TO REF LAB; TAT 18-24 HRS): SARS-CoV-2, NAA: NOT DETECTED

## 2019-08-25 ENCOUNTER — Encounter (HOSPITAL_COMMUNITY): Payer: Self-pay | Admitting: *Deleted

## 2019-08-25 ENCOUNTER — Inpatient Hospital Stay (HOSPITAL_COMMUNITY): Payer: Medicare Other

## 2019-08-25 ENCOUNTER — Encounter (HOSPITAL_COMMUNITY)
Admission: RE | Disposition: A | Discharge status: Home / Self Care | Payer: Self-pay | Source: Home / Self Care | Attending: Orthopedic Surgery

## 2019-08-25 ENCOUNTER — Inpatient Hospital Stay (HOSPITAL_COMMUNITY): Payer: Medicare Other | Admitting: Certified Registered"

## 2019-08-25 ENCOUNTER — Other Ambulatory Visit: Payer: Self-pay

## 2019-08-25 ENCOUNTER — Inpatient Hospital Stay (HOSPITAL_COMMUNITY)
Admission: RE | Admit: 2019-08-25 | Discharge: 2019-08-26 | DRG: 483 | Disposition: A | Discharge status: Home / Self Care | Payer: Medicare Other | Attending: Orthopedic Surgery | Admitting: Orthopedic Surgery

## 2019-08-25 DIAGNOSIS — M75101 Unspecified rotator cuff tear or rupture of right shoulder, not specified as traumatic: Secondary | ICD-10-CM | POA: Diagnosis present

## 2019-08-25 DIAGNOSIS — Z20828 Contact with and (suspected) exposure to other viral communicable diseases: Secondary | ICD-10-CM | POA: Diagnosis present

## 2019-08-25 DIAGNOSIS — M19011 Primary osteoarthritis, right shoulder: Secondary | ICD-10-CM | POA: Diagnosis present

## 2019-08-25 DIAGNOSIS — Z96652 Presence of left artificial knee joint: Secondary | ICD-10-CM | POA: Diagnosis present

## 2019-08-25 DIAGNOSIS — E785 Hyperlipidemia, unspecified: Secondary | ICD-10-CM | POA: Diagnosis present

## 2019-08-25 DIAGNOSIS — H9193 Unspecified hearing loss, bilateral: Secondary | ICD-10-CM | POA: Diagnosis present

## 2019-08-25 DIAGNOSIS — Z96611 Presence of right artificial shoulder joint: Secondary | ICD-10-CM

## 2019-08-25 HISTORY — PX: REVERSE SHOULDER ARTHROPLASTY: SHX5054

## 2019-08-25 SURGERY — ARTHROPLASTY, SHOULDER, TOTAL, REVERSE
Anesthesia: General | Site: Shoulder | Laterality: Right

## 2019-08-25 MED ORDER — DOCUSATE SODIUM 100 MG PO CAPS
100.0000 mg | ORAL_CAPSULE | Freq: Two times a day (BID) | ORAL | Status: DC
  Administered 2019-08-25: 100 mg via ORAL
  Filled 2019-08-25 (×2): qty 1

## 2019-08-25 MED ORDER — SUCCINYLCHOLINE CHLORIDE 200 MG/10ML IV SOSY
PREFILLED_SYRINGE | INTRAVENOUS | Status: AC
  Filled 2019-08-25: qty 10

## 2019-08-25 MED ORDER — CEFAZOLIN SODIUM-DEXTROSE 2-4 GM/100ML-% IV SOLN
2.0000 g | Freq: Four times a day (QID) | INTRAVENOUS | Status: AC
  Administered 2019-08-25 – 2019-08-26 (×3): 2 g via INTRAVENOUS
  Filled 2019-08-25 (×4): qty 100

## 2019-08-25 MED ORDER — EPHEDRINE 5 MG/ML INJ
INTRAVENOUS | Status: AC
  Filled 2019-08-25: qty 10

## 2019-08-25 MED ORDER — FENTANYL CITRATE (PF) 100 MCG/2ML IJ SOLN
50.0000 ug | INTRAMUSCULAR | Status: DC
  Administered 2019-08-25: 10:00:00 50 ug via INTRAVENOUS

## 2019-08-25 MED ORDER — SUCCINYLCHOLINE CHLORIDE 200 MG/10ML IV SOSY
PREFILLED_SYRINGE | INTRAVENOUS | Status: DC | PRN
  Administered 2019-08-25: 100 mg via INTRAVENOUS

## 2019-08-25 MED ORDER — METHOCARBAMOL 500 MG PO TABS
500.0000 mg | ORAL_TABLET | Freq: Four times a day (QID) | ORAL | Status: DC | PRN
  Administered 2019-08-25: 500 mg via ORAL
  Filled 2019-08-25 (×2): qty 1

## 2019-08-25 MED ORDER — PROPOFOL 10 MG/ML IV BOLUS
INTRAVENOUS | Status: AC
  Filled 2019-08-25: qty 20

## 2019-08-25 MED ORDER — PROPOFOL 10 MG/ML IV BOLUS
INTRAVENOUS | Status: DC | PRN
  Administered 2019-08-25: 120 mg via INTRAVENOUS

## 2019-08-25 MED ORDER — ONDANSETRON HCL 4 MG/2ML IJ SOLN: 4.0000 mg | Freq: Four times a day (QID) | INTRAMUSCULAR | Status: DC | PRN

## 2019-08-25 MED ORDER — BUPIVACAINE HCL (PF) 0.5 % IJ SOLN
INTRAMUSCULAR | Status: DC | PRN
  Administered 2019-08-25: 10 mL

## 2019-08-25 MED ORDER — ONDANSETRON HCL 4 MG/2ML IJ SOLN
INTRAMUSCULAR | Status: DC | PRN
  Administered 2019-08-25: 4 mg via INTRAVENOUS

## 2019-08-25 MED ORDER — METOCLOPRAMIDE HCL 5 MG/ML IJ SOLN: 5.0000 mg | Freq: Three times a day (TID) | INTRAMUSCULAR | Status: DC | PRN

## 2019-08-25 MED ORDER — SODIUM CHLORIDE 0.9 % IR SOLN
Status: DC | PRN
  Administered 2019-08-25: 1000 mL

## 2019-08-25 MED ORDER — BUPIVACAINE-EPINEPHRINE 0.25% -1:200000 IJ SOLN
INTRAMUSCULAR | Status: AC
  Filled 2019-08-25: qty 1

## 2019-08-25 MED ORDER — ONDANSETRON HCL 4 MG/2ML IJ SOLN: 4.0000 mg | Freq: Once | INTRAMUSCULAR | Status: DC | PRN

## 2019-08-25 MED ORDER — LIDOCAINE 2% (20 MG/ML) 5 ML SYRINGE
INTRAMUSCULAR | Status: DC | PRN
  Administered 2019-08-25: 40 mg via INTRAVENOUS

## 2019-08-25 MED ORDER — METHOCARBAMOL 500 MG IVPB - SIMPLE MED
500.0000 mg | Freq: Four times a day (QID) | INTRAVENOUS | Status: DC | PRN
  Filled 2019-08-25: qty 50

## 2019-08-25 MED ORDER — POLYETHYLENE GLYCOL 3350 17 G PO PACK: 17.0000 g | PACK | Freq: Every day | ORAL | Status: DC | PRN

## 2019-08-25 MED ORDER — PHENYLEPHRINE HCL-NACL 10-0.9 MG/250ML-% IV SOLN
INTRAVENOUS | Status: DC | PRN
  Administered 2019-08-25: 30 ug/min via INTRAVENOUS

## 2019-08-25 MED ORDER — ACETAMINOPHEN 325 MG PO TABS: 325.0000 mg | ORAL_TABLET | Freq: Four times a day (QID) | ORAL | Status: DC | PRN

## 2019-08-25 MED ORDER — CHLORHEXIDINE GLUCONATE 4 % EX LIQD: 60.0000 mL | Freq: Once | CUTANEOUS | Status: DC

## 2019-08-25 MED ORDER — BUPIVACAINE LIPOSOME 1.3 % IJ SUSP
INTRAMUSCULAR | Status: DC | PRN
  Administered 2019-08-25: 10 mL via PERINEURAL

## 2019-08-25 MED ORDER — ONDANSETRON HCL 4 MG/2ML IJ SOLN
INTRAMUSCULAR | Status: AC
  Filled 2019-08-25: qty 2

## 2019-08-25 MED ORDER — MENTHOL 3 MG MT LOZG: 1.0000 | LOZENGE | OROMUCOSAL | Status: DC | PRN

## 2019-08-25 MED ORDER — LIDOCAINE 2% (20 MG/ML) 5 ML SYRINGE
INTRAMUSCULAR | Status: AC
  Filled 2019-08-25: qty 5

## 2019-08-25 MED ORDER — MORPHINE SULFATE (PF) 4 MG/ML IV SOLN: 0.5000 mg | INTRAVENOUS | Status: DC | PRN

## 2019-08-25 MED ORDER — HYDROCODONE-ACETAMINOPHEN 5-325 MG PO TABS
1.0000 | ORAL_TABLET | ORAL | Status: DC | PRN
  Administered 2019-08-25: 1 via ORAL
  Filled 2019-08-25 (×2): qty 1

## 2019-08-25 MED ORDER — BUPIVACAINE-EPINEPHRINE (PF) 0.25% -1:200000 IJ SOLN
INTRAMUSCULAR | Status: DC | PRN
  Administered 2019-08-25: 12 mL

## 2019-08-25 MED ORDER — CEFAZOLIN SODIUM-DEXTROSE 2-4 GM/100ML-% IV SOLN
2.0000 g | INTRAVENOUS | Status: AC
  Administered 2019-08-25: 2 g via INTRAVENOUS
  Filled 2019-08-25: qty 100

## 2019-08-25 MED ORDER — PHENYLEPHRINE HCL (PRESSORS) 10 MG/ML IV SOLN
INTRAVENOUS | Status: AC
  Filled 2019-08-25: qty 1

## 2019-08-25 MED ORDER — MIDAZOLAM HCL 2 MG/2ML IJ SOLN: 1.0000 mg | INTRAMUSCULAR | Status: DC

## 2019-08-25 MED ORDER — HYDROCODONE-ACETAMINOPHEN 5-325 MG PO TABS: 1.0000 | ORAL_TABLET | Freq: Four times a day (QID) | ORAL | 0 refills | Status: DC | PRN

## 2019-08-25 MED ORDER — ONDANSETRON HCL 4 MG PO TABS: 4.0000 mg | ORAL_TABLET | Freq: Four times a day (QID) | ORAL | Status: DC | PRN

## 2019-08-25 MED ORDER — METOCLOPRAMIDE HCL 5 MG PO TABS: 5.0000 mg | ORAL_TABLET | Freq: Three times a day (TID) | ORAL | Status: DC | PRN

## 2019-08-25 MED ORDER — FENTANYL CITRATE (PF) 100 MCG/2ML IJ SOLN
INTRAMUSCULAR | Status: DC | PRN
  Administered 2019-08-25: 50 ug via INTRAVENOUS
  Administered 2019-08-25 (×2): 25 ug via INTRAVENOUS

## 2019-08-25 MED ORDER — PHENOL 1.4 % MT LIQD: 1.0000 | OROMUCOSAL | Status: DC | PRN

## 2019-08-25 MED ORDER — FENTANYL CITRATE (PF) 100 MCG/2ML IJ SOLN
INTRAMUSCULAR | Status: AC
  Administered 2019-08-25: 50 ug via INTRAVENOUS
  Filled 2019-08-25: qty 2

## 2019-08-25 MED ORDER — MIDAZOLAM HCL 2 MG/2ML IJ SOLN
INTRAMUSCULAR | Status: AC
  Filled 2019-08-25: qty 2

## 2019-08-25 MED ORDER — DEXAMETHASONE SODIUM PHOSPHATE 10 MG/ML IJ SOLN
INTRAMUSCULAR | Status: DC | PRN
  Administered 2019-08-25: 4 mg via INTRAVENOUS

## 2019-08-25 MED ORDER — LACTATED RINGERS IV SOLN
INTRAVENOUS | Status: DC
  Administered 2019-08-25 (×2): via INTRAVENOUS

## 2019-08-25 MED ORDER — FENTANYL CITRATE (PF) 100 MCG/2ML IJ SOLN: 25.0000 ug | INTRAMUSCULAR | Status: DC | PRN

## 2019-08-25 MED ORDER — FENTANYL CITRATE (PF) 100 MCG/2ML IJ SOLN
INTRAMUSCULAR | Status: AC
  Filled 2019-08-25: qty 2

## 2019-08-25 MED ORDER — PHENYLEPHRINE 40 MCG/ML (10ML) SYRINGE FOR IV PUSH (FOR BLOOD PRESSURE SUPPORT)
PREFILLED_SYRINGE | INTRAVENOUS | Status: AC
  Filled 2019-08-25: qty 10

## 2019-08-25 MED ORDER — DEXAMETHASONE SODIUM PHOSPHATE 10 MG/ML IJ SOLN
INTRAMUSCULAR | Status: AC
  Filled 2019-08-25: qty 1

## 2019-08-25 MED ORDER — EPHEDRINE SULFATE-NACL 50-0.9 MG/10ML-% IV SOSY
PREFILLED_SYRINGE | INTRAVENOUS | Status: DC | PRN
Start: 2019-08-25 — End: 2019-08-25
  Administered 2019-08-25 (×4): 10 mg via INTRAVENOUS

## 2019-08-25 MED ORDER — MEPERIDINE HCL 50 MG/ML IJ SOLN: 6.2500 mg | INTRAMUSCULAR | Status: DC | PRN

## 2019-08-25 MED ORDER — SODIUM CHLORIDE 0.9 % IV SOLN
INTRAVENOUS | Status: DC
  Administered 2019-08-25: 15:00:00 via INTRAVENOUS

## 2019-08-25 SURGICAL SUPPLY — 70 items
BAG ZIPLOCK 12X15 (MISCELLANEOUS) IMPLANT
BIT DRILL 1.6MX128 (BIT) IMPLANT
BIT DRILL 170X2.5X (BIT) IMPLANT
BIT DRL 170X2.5X (BIT) ×1
BLADE SAG 18X100X1.27 (BLADE) ×2 IMPLANT
COVER BACK TABLE 60X90IN (DRAPES) ×2 IMPLANT
COVER SURGICAL LIGHT HANDLE (MISCELLANEOUS) ×2 IMPLANT
COVER WAND RF STERILE (DRAPES) IMPLANT
CUP D38 DXTEND STAND PLUS 6 HU (Orthopedic Implant) ×2 IMPLANT
CUP STD D38 DXTEND PLUS 6 HU (Orthopedic Implant) IMPLANT
DECANTER SPIKE VIAL GLASS SM (MISCELLANEOUS) ×2 IMPLANT
DRAPE INCISE IOBAN 66X45 STRL (DRAPES) ×2 IMPLANT
DRAPE ORTHO SPLIT 77X108 STRL (DRAPES) ×2
DRAPE SHEET LG 3/4 BI-LAMINATE (DRAPES) ×2 IMPLANT
DRAPE SURG ORHT 6 SPLT 77X108 (DRAPES) ×2 IMPLANT
DRAPE U-SHAPE 47X51 STRL (DRAPES) ×2 IMPLANT
DRILL 2.5 (BIT) ×1
DRSG ADAPTIC 3X8 NADH LF (GAUZE/BANDAGES/DRESSINGS) ×2 IMPLANT
DRSG PAD ABDOMINAL 8X10 ST (GAUZE/BANDAGES/DRESSINGS) ×2 IMPLANT
DURAPREP 26ML APPLICATOR (WOUND CARE) ×2 IMPLANT
ECCENTRIC EPIPHYSI MODULAR SZ1 (Trauma) IMPLANT
ELECT BLADE TIP CTD 4 INCH (ELECTRODE) ×2 IMPLANT
ELECT NDL TIP 2.8 STRL (NEEDLE) ×1 IMPLANT
ELECT NEEDLE TIP 2.8 STRL (NEEDLE) ×2 IMPLANT
ELECT REM PT RETURN 15FT ADLT (MISCELLANEOUS) ×2 IMPLANT
GAUZE SPONGE 4X4 12PLY STRL (GAUZE/BANDAGES/DRESSINGS) ×2 IMPLANT
GLENOSPHERE XTEND RSA 38 SD +4 (Joint) ×1 IMPLANT
GLOVE BIOGEL PI ORTHO PRO 7.5 (GLOVE) ×1
GLOVE BIOGEL PI ORTHO PRO SZ8 (GLOVE) ×1
GLOVE ORTHO TXT STRL SZ7.5 (GLOVE) ×2 IMPLANT
GLOVE PI ORTHO PRO STRL 7.5 (GLOVE) ×1 IMPLANT
GLOVE PI ORTHO PRO STRL SZ8 (GLOVE) ×1 IMPLANT
GLOVE SURG ORTHO 8.5 STRL (GLOVE) ×2 IMPLANT
GOWN STRL REUS W/TWL XL LVL3 (GOWN DISPOSABLE) ×4 IMPLANT
KIT BASIN OR (CUSTOM PROCEDURE TRAY) ×2 IMPLANT
KIT TURNOVER KIT A (KITS) IMPLANT
MANIFOLD NEPTUNE II (INSTRUMENTS) ×2 IMPLANT
METAGLENE DELTA EXTEND (Trauma) IMPLANT
METAGLENE DXTEND (Trauma) ×2 IMPLANT
MODULAR ECCENTRIC EPIPHYSI SZ1 (Trauma) ×2 IMPLANT
NDL MAYO CATGUT SZ4 TPR NDL (NEEDLE) IMPLANT
NEEDLE MAYO CATGUT SZ4 (NEEDLE) IMPLANT
PACK SHOULDER (CUSTOM PROCEDURE TRAY) ×2 IMPLANT
PIN GUIDE 1.2 (PIN) ×1 IMPLANT
PIN GUIDE GLENOPHERE 1.5MX300M (PIN) ×1 IMPLANT
PIN METAGLENE 2.5 (PIN) ×1 IMPLANT
PIN STEINMAN FIXATION KNEE (PIN) ×1 IMPLANT
RESTRAINT HEAD UNIVERSAL NS (MISCELLANEOUS) ×2 IMPLANT
SCREW 4.5X18MM (Screw) ×1 IMPLANT
SCREW 4.5X36MM (Screw) ×1 IMPLANT
SCREW BN 18X4.5XSTRL SHLDR (Screw) IMPLANT
SCREW LOCK 42 (Screw) ×1 IMPLANT
SLING ARM FOAM STRAP LRG (SOFTGOODS) ×1 IMPLANT
SMARTMIX MINI TOWER (MISCELLANEOUS)
SPONGE LAP 4X18 RFD (DISPOSABLE) IMPLANT
STEM DELTA DIA 10 HA (Stem) ×1 IMPLANT
STRIP CLOSURE SKIN 1/2X4 (GAUZE/BANDAGES/DRESSINGS) ×2 IMPLANT
SUCTION FRAZIER HANDLE 10FR (MISCELLANEOUS) ×1
SUCTION TUBE FRAZIER 10FR DISP (MISCELLANEOUS) ×1 IMPLANT
SUT FIBERWIRE #2 38 T-5 BLUE (SUTURE) ×2
SUT MNCRL AB 4-0 PS2 18 (SUTURE) ×2 IMPLANT
SUT VIC AB 0 CT1 36 (SUTURE) ×4 IMPLANT
SUT VIC AB 0 CT2 27 (SUTURE) ×2 IMPLANT
SUT VIC AB 2-0 CT1 27 (SUTURE) ×2
SUT VIC AB 2-0 CT1 TAPERPNT 27 (SUTURE) ×1 IMPLANT
SUTURE FIBERWR #2 38 T-5 BLUE (SUTURE) ×1 IMPLANT
TOWEL OR 17X26 10 PK STRL BLUE (TOWEL DISPOSABLE) ×2 IMPLANT
TOWER SMARTMIX MINI (MISCELLANEOUS) IMPLANT
WATER STERILE IRR 1000ML POUR (IV SOLUTION) ×2 IMPLANT
YANKAUER SUCT BULB TIP 10FT TU (MISCELLANEOUS) ×2 IMPLANT

## 2019-08-25 NOTE — Interval H&P Note (Signed)
History and Physical Interval Note:  08/25/2019 10:15 AM  Michael Burch  has presented today for surgery, with the diagnosis of Right rotator cuff tear.  The various methods of treatment have been discussed with the patient and family. After consideration of risks, benefits and other options for treatment, the patient has consented to  Procedure(s) with comments: REVERSE SHOULDER ARTHROPLASTY (Right) - with interscalene block as a surgical intervention.  The patient's history has been reviewed, patient examined, no change in status, stable for surgery.  I have reviewed the patient's chart and labs.  Questions were answered to the patient's satisfaction.     Augustin Schooling

## 2019-08-25 NOTE — Brief Op Note (Signed)
08/25/2019  12:36 PM  PATIENT:  Michael Burch  78 y.o. male  PRE-OPERATIVE DIAGNOSIS:  Right rotator cuff tear arthropathy  POST-OPERATIVE DIAGNOSIS:  Right rotator cuff tear arthropathy  PROCEDURE:  Procedure(s) with comments: REVERSE TOTAL SHOULDER ARTHROPLASTY (Right) - with interscalene block DePuy Delta Xtend  NO Subscap repair  SURGEON:  Surgeon(s) and Role:    [name], MD - Primary  PHYSICIAN ASSISTANT:   ASSISTANTS: [place] Bruns, PA-C   ANESTHESIA:   regional and general  EBL:  125 mL   BLOOD ADMINISTERED:none  DRAINS: none   LOCAL MEDICATIONS USED:  MARCAINE     SPECIMEN:  No Specimen  DISPOSITION OF SPECIMEN:  N/A  COUNTS:  YES  TOURNIQUET:  * No tourniquets in log *  DICTATION: .Other Dictation: Dictation Number [id number]  PLAN OF CARE: Admit to inpatient   PATIENT DISPOSITION:  PACU - hemodynamically stable.   Delay start of Pharmacological VTE agent (>24hrs) due to surgical blood loss or risk of bleeding: not applicable

## 2019-08-25 NOTE — Anesthesia Preprocedure Evaluation (Signed)
Anesthesia Evaluation  Patient identified by MRN, date of birth, ID band Patient awake    Reviewed: Allergy & Precautions, NPO status , Patient's Chart, lab work & pertinent test results  Airway Mallampati: II  TM Distance: >3 FB Neck ROM: Full    Dental  (+) Teeth Intact, Dental Advisory Given   Pulmonary pneumonia, resolved,    breath sounds clear to auscultation       Cardiovascular negative cardio ROS   Rhythm:Regular Rate:Normal     Neuro/Psych negative neurological ROS  negative psych ROS   GI/Hepatic negative GI ROS, Neg liver ROS, GERD  ,  Endo/Other  negative endocrine ROS  Renal/GU negative Renal ROS     Musculoskeletal  (+) Arthritis , Osteoarthritis,    Abdominal   Peds  Hematology negative hematology ROS (+)   Anesthesia Other Findings   Reproductive/Obstetrics                             Anesthesia Physical  Anesthesia Plan  ASA: II  Anesthesia Plan: General   Post-op Pain Management: GA combined w/ Regional for post-op pain   Induction: Intravenous  PONV Risk Score and Plan: 0 and Ondansetron, Dexamethasone and Treatment may vary due to age or medical condition  Airway Management Planned: Oral ETT  Additional Equipment: None  Intra-op Plan:   Post-operative Plan: Extubation in OR  Informed Consent: I have reviewed the patients History and Physical, chart, labs and discussed the procedure including the risks, benefits and alternatives for the proposed anesthesia with the patient or authorized representative who has indicated his/her understanding and acceptance.     Dental advisory given  Plan Discussed with: CRNA  Anesthesia Plan Comments:         Anesthesia Quick Evaluation

## 2019-08-25 NOTE — Progress Notes (Signed)
Assisted Dr. Germeroth with right, ultrasound guided, interscalene  block. Side rails up, monitors on throughout procedure. See vital signs in flow sheet. Tolerated Procedure well.  

## 2019-08-25 NOTE — Transfer of Care (Signed)
Immediate Anesthesia Transfer of Care Note  Patient: Michael Burch  Procedure(s) Performed: REVERSE TOTAL SHOULDER ARTHROPLASTY (Right Shoulder)  Patient Location: PACU  Anesthesia Type:General  Level of Consciousness: awake  Airway & Oxygen Therapy: Patient Spontanous Breathing and Patient connected to face mask oxygen  Post-op Assessment: Report given to RN and Post -op Vital signs reviewed and stable  Post vital signs: Reviewed and stable  Last Vitals:  Vitals Value Taken Time  BP    Temp    Pulse 60 08/25/19 1238  Resp 10 08/25/19 1238  SpO2 100 % 08/25/19 1238  Vitals shown include unvalidated device data.  Last Pain:  Vitals:   08/25/19 1020  TempSrc:   PainSc: 0-No pain      Patients Stated Pain Goal: 3 ([id number])  Complications: No apparent anesthesia complications

## 2019-08-25 NOTE — Discharge Instructions (Signed)
Ice to the shoulder constantly.  Keep the incision covered and clean and dry for one week, then ok to get it wet in the shower. ° °Do exercise as instructed several times per day. ° °DO NOT reach behind your back or push up out of a chair with the operative arm. ° °Use a sling while you are up and around for comfort, may remove while seated.  Keep pillow propped behind the operative elbow. ° °Follow up with Dr Johnwilliam Shepperson in two weeks in the office, call [phone] for appt °

## 2019-08-25 NOTE — Anesthesia Postprocedure Evaluation (Signed)
Anesthesia Post Note  Patient: Michael Burch  Procedure(s) Performed: REVERSE TOTAL SHOULDER ARTHROPLASTY (Right Shoulder)     Patient location during evaluation: PACU Anesthesia Type: General Level of consciousness: sedated and patient cooperative Pain management: pain level controlled Vital Signs Assessment: post-procedure vital signs reviewed and stable Respiratory status: spontaneous breathing Cardiovascular status: stable Anesthetic complications: no    Last Vitals:  Vitals:   08/25/19 1020 08/25/19 1238  BP: 106/64   Pulse: 65   Resp: 14   Temp:  (!) 36.2 C  SpO2: 100%     Last Pain:  Vitals:   08/25/19 1238  TempSrc:   PainSc: 0-No pain                 Nolon Nations

## 2019-08-25 NOTE — Op Note (Signed)
Michael Burch, APA MEDICAL RECORD [id number] ACCOUNT [id number] DATE OF BIRTH:11-16-1940 FACILITY: [facility] LOCATION: [facility] PHYSICIAN:STEVEN R. Caliya Narine, MD  OPERATIVE REPORT  DATE OF PROCEDURE:  08/25/2019  PREOPERATIVE DIAGNOSIS:  Right shoulder end-stage osteoarthritis with rotator cuff insufficiency.  POSTOPERATIVE DIAGNOSIS:  Right shoulder end-stage osteoarthritis with rotator cuff insufficiency.  PROCEDURE PERFORMED:  Right reverse total shoulder arthroplasty with no subscap repair using DePuy Delta Xtend prosthesis.  ATTENDING SURGEON:  Esmond Plants, MD  ASSISTANT:  Darol Destine, [place], who was scrubbed during the entire procedure and necessary for satisfactory completion of surgery.  ANESTHESIA:  General anesthesia was used plus interscalene block.  ESTIMATED BLOOD LOSS:  150 mL.  FLUID REPLACEMENT:  1500 mL crystalloid.  INSTRUMENT COUNTS:  Correct.  COMPLICATIONS:  No complications.  ANTIBIOTICS:  Perioperative antibiotics were given.  INDICATIONS:  The patient is a 78 year old male with worsening right shoulder pain and dysfunction secondary to rotator cuff tear arthropathy.  The patient has had progressive pain despite conservative management and has bone-on-bone on his x-rays and  MRI scan.  Due to concern over his poor function and likely rotator cuff insufficiency, we recommended reverse shoulder placement.  All options for management were discussed in detail with the patient and the family and they elected to proceed with the  plan for reverse shoulder replacement.  Informed consent obtained.  DESCRIPTION OF PROCEDURE:  After an adequate level of anesthesia was achieved, the patient was positioned in modified beach chair position.  Right shoulder correctly identified and sterilely prepped and draped in the usual manner.  Time-out called,  verifying correct patient, correct site.  We initiated surgery using standard deltopectoral approach starting at the  coracoid process extending down to the anterior humerus.  Dissection down through subcutaneous tissues using the Bovie.  We identified  the cephalic vein, took the vein laterally with the deltoid pectoralis taken medially.  Conjoined tendon identified and retracted medially.  We tenodesed the biceps in situ with 0 Vicryl figure-of-eight suture x2 incorporating the pectoralis tendon.  We  then released the subscapularis remnant off the lesser tuberosity.  It was felt that this tendon was extremely thin and the muscle was also atrophied and would likely not be repaired, but tagged to use for protection of the axillary nerve.  We released  the inferior capsule progressively externally rotating the humerus.  We then went ahead and extended the shoulder delivering the humeral head out of the wound.  We entered the proximal humerus with a 6 mm reamer, reaming up reaming up to a size 10.  Once  we had the 10 mm reamer in, we placed our 10 mm intramedullary resection guide and resected the head with an oscillating saw at 10 degrees of retroversion.  Once we had the head resection done, we removed excess osteophytes with a large rongeur.  We  then reduced the shoulder and subluxed posteriorly.  We gained good exposure of the glenoid face.  We did a 360 degree capsular labral excision getting down to the bone.  There was a pretty large anterior superior osteophyte noted, which we removed using  a large rongeur.  Once we were able to find the native glenoid, we found the center point with a guide pin angling a little inferiorly and then reamed for the metaglene baseplate.  We then did our peripheral hand reaming drilled our central peg hole and  then impacted the baseplate into position.  We were able to get a 42 screw inferiorly, a 36 at  the base of the coracoid and an 18 nonlocked anteriorly.  Great baseplate security and fixation.  We then selected a 38+4 glenosphere, so a +4 offset and a 38  standard.  We attached  that to the baseplate.  We irrigated thoroughly and did a finger sweep to make sure no soft tissue was incorporated in that bearing.  We completed our preparation on the humeral side, reaming for the 1 right metaphysis.  Once we  had that completed, we selected the 10 stem 1 right impacted that in position.  We trialed previously with a 38+3 and selected a 38+6 poly and impacted that and humeral tray and then reduced the shoulder.  We were very stable.  No gapping with inferior  pole or external rotation and not excessively tight on the conjoined or on the nerve.  We removed the remnant of the subscapularis using the Bovie.  We irrigated again and closed deltopectoral interval with 0 Vicryl suture followed by 2-0 Vicryl for  subcutaneous closure and 4-0 Monocryl for skin.  Steri-Strips applied followed by sterile dressing.  The patient tolerated surgery well.  TN/NUANCE  D:08/25/2019 T:08/25/2019 JOB:[id number]

## 2019-08-25 NOTE — Progress Notes (Signed)
Attempted to return Versed to Short Stay pyxis, witnessed by Charmayne Sheer, RN. Drawer did not open, walked Versed to pharmacy for return.

## 2019-08-25 NOTE — Anesthesia Procedure Notes (Signed)
Procedure Name: Intubation Date/Time: 08/25/2019 10:46 AM Performed by: Niel Hummer, CRNA Pre-anesthesia Checklist: Patient identified, Emergency Drugs available, Suction available and Patient being monitored Patient Re-evaluated:Patient Re-evaluated prior to induction Oxygen Delivery Method: Circle system utilized Preoxygenation: Pre-oxygenation with 100% oxygen Induction Type: IV induction and Rapid sequence Laryngoscope Size: Mac and 4 Grade View: Grade I Tube type: Oral Tube size: 7.5 mm Number of attempts: 1 Airway Equipment and Method: Stylet Placement Confirmation: ETT inserted through vocal cords under direct vision,  positive ETCO2 and breath sounds checked- equal and bilateral Secured at: 22 cm Tube secured with: Tape Dental Injury: Teeth and Oropharynx as per pre-operative assessment

## 2019-08-25 NOTE — Anesthesia Procedure Notes (Signed)
Anesthesia Regional Block: Interscalene brachial plexus block   Pre-Anesthetic Checklist: ,, timeout performed, Correct Patient, Correct Site, Correct Laterality, Correct Procedure, Correct Position, site marked, Risks and benefits discussed,  Surgical consent,  Pre-op evaluation,  At surgeon's request and post-op pain management  Laterality: Right  Prep: chloraprep       Needles:  Injection technique: Single-shot  Needle Type: Stimulator Needle - 40     Needle Length: 4cm  Needle Gauge: 22     Additional Needles:   Procedures:,,,, ultrasound used (permanent image in chart),,,,  Narrative:  Start time: 08/25/2019 10:00 AM End time: 08/25/2019 10:05 AM Injection made incrementally with aspirations every 5 mL. Anesthesiologist: Nolon Nations, MD  Additional Notes: BP cuff, EKG monitors applied. Sedation begun. Nerve location verified with U/S. Anesthetic injected incrementally, slowly , and after neg aspirations under direct u/s guidance. Good perineural spread. Tolerated well.

## 2019-08-26 LAB — HEMOGLOBIN AND HEMATOCRIT, BLOOD
HCT: 33.9 % — ABNORMAL LOW (ref 39.0–52.0)
Hemoglobin: 11.4 g/dL — ABNORMAL LOW (ref 13.0–17.0)

## 2019-08-26 LAB — BASIC METABOLIC PANEL
Anion gap: 7 (ref 5–15)
BUN: 16 mg/dL (ref 8–23)
CO2: 23 mmol/L (ref 22–32)
Calcium: 8.8 mg/dL — ABNORMAL LOW (ref 8.9–10.3)
Chloride: 105 mmol/L (ref 98–111)
Creatinine, Ser: 1.07 mg/dL (ref 0.61–1.24)
GFR calc Af Amer: >60 mL/min (ref >60)
GFR calc non Af Amer: >60 mL/min (ref >60)
Glucose, Bld: 160 mg/dL — ABNORMAL HIGH (ref 70–99)
Potassium: 4 mmol/L (ref 3.5–5.1)
Sodium: 135 mmol/L (ref 135–145)

## 2019-08-26 NOTE — Plan of Care (Signed)
  Problem: Education: Goal: Knowledge of the prescribed therapeutic regimen will improve Outcome: Progressing   Problem: Education: Goal: Understanding of activity limitations/precautions following surgery will improve Outcome: Progressing   Problem: Education: Goal: Individualized Educational Video(s) Outcome: Progressing   Problem: Activity: Goal: Ability to tolerate increased activity will improve Outcome: Progressing   Problem: Pain Management: Goal: Pain level will decrease with appropriate interventions Outcome: Progressing   Problem: Education: Goal: Knowledge of General Education information will improve Description: Including pain rating scale, medication(s)/side effects and non-pharmacologic comfort measures Outcome: Progressing

## 2019-08-26 NOTE — Discharge Summary (Signed)
  Orthopedic Discharge Summary        Physician Discharge Summary  Patient ID: Michael Burch MRN: [id number] DOB/AGE: June 06, 1941 78 y.o.  Admit date: 08/25/2019 Discharge date: 08/26/2019   Procedures:  Procedure(s) (LRB): REVERSE TOTAL SHOULDER ARTHROPLASTY (Right)  Attending Physician:  Dr. Esmond Plants  Admission Diagnoses:   Right shoulder rotator cuff tear arthropathy  Discharge Diagnoses:  same   Past Medical History:  Diagnosis Date  . Arthritis   . Bilateral hearing loss   . H/O echocardiogram 02/18/2010   normal lv systolic, mildly impaired LV relaxation,trace MR,Trace TR  . History of BPH   . History of vertigo 09/13/2013  . Hyperlipidemia   . OA (osteoarthritis)   . Pneumonia 02/25/2016  . UTI (urinary tract infection) 11/2018    PCP: [name], MD   Discharged Condition: good  Hospital Course:  Patient underwent the above stated procedure on 08/25/2019. Patient tolerated the procedure well and brought to the recovery room in good condition and subsequently to the floor. Patient had an uncomplicated hospital course and was stable for discharge.   Disposition: Discharge disposition: 01-Home or Self Care      with follow up in 2 weeks   Follow-up Information    [name], MD. Call in 2 weeks.   Specialty: Orthopedic Surgery Why: [phone] Contact information: [address] [place] [address] [phone]           Discharge Instructions    Call MD / Call 911   Complete by: As directed    If you experience chest pain or shortness of breath, CALL 911 and be transported to the hospital emergency room.  If you develope a fever above 101 F, pus (white drainage) or increased drainage or redness at the wound, or calf pain, call your surgeon's office.   Constipation Prevention   Complete by: As directed    Drink plenty of fluids.  Prune juice may be helpful.  You may use a stool softener, such as Colace  (over the counter) 100 mg twice a day.  Use MiraLax (over the counter) for constipation as needed.   Diet - low sodium heart healthy   Complete by: As directed    Increase activity slowly as tolerated   Complete by: As directed       Allergies as of 08/26/2019   No Known Allergies     Medication List    TAKE these medications   HYDROcodone-acetaminophen 5-325 MG tablet Commonly known as: Norco Take 1 tablet by mouth every 6 (six) hours as needed for moderate pain.         Signed: Augustin Schooling 08/26/2019, 8:05 AM  [facility] is now [facility] [address] [place], [place], [place] [address] Phone: [place]

## 2019-08-26 NOTE — Evaluation (Signed)
Occupational Therapy Evaluation Patient Details Name: Michael Burch MRN: [id number] DOB: 1940/12/09 Today's Date: 08/26/2019    History of Present Illness Pt is a 78 y/o male now s/p reverse R TSA   Clinical Impression   This 78 y/o male presents with the above. PTA pt independent with ADL and functional mobility. Daughter present during session and assisting to interpret as well as provide PLOF/home setup information. Pt currently requiring minguard assist for room level mobility without AD, requiring mod-maxA for seated UB ADL (including sling management), modA for LB ADL secondary to RUE functional limitations and pt also still feeling effects of nerve block in UE. Educated both pt and pt's daughter re: shoulder precautions, sling management, HEP, safety and compensatory strategies for performing ADL and functional transfers with pt verbalizing/return demonstrating understanding. Pt intermittently requiring min cues for carryover of precautions during functional tasks. Pt to return home with available 24hr family assist for ADL/iADL PRN. Questions answered throughout session. Feel Pt is safe to return home once medically ready given available family assist and follow up with therapies as recommended per MD. Acute OT to sign off at this time, thank you for this referral.     Follow Up Recommendations  Follow surgeon's recommendation for DC plan and follow-up therapies;Supervision/Assistance - 24 hour    Equipment Recommendations  None recommended by OT           Precautions / Restrictions Precautions Precautions: Shoulder;Fall Type of Shoulder Precautions: sling for comfort/sleep, NWB to UE, okay for AROM e/w/h to tolerance, okay for A/PROM within the following: FF 0-90, ABD 0-60, ER 0-30; okay for active ADL Shoulder Interventions: Shoulder sling/immobilizer;For comfort(while sleeping) Precaution Booklet Issued: Yes (comment) Precaution Comments: issued and reviewed with pt/pt's  daughter Required Braces or Orthoses: Sling Restrictions Weight Bearing Restrictions: Yes RUE Weight Bearing: Non weight bearing      Mobility Bed Mobility Overal bed mobility: Needs Assistance Bed Mobility: Supine to Sit     Supine to sit: Min guard;HOB elevated     General bed mobility comments: for safety  Transfers Overall transfer level: Needs assistance Equipment used: None Transfers: Sit to/from Stand Sit to Stand: Min guard         General transfer comment: for safety and balance    Balance Overall balance assessment: Mild deficits observed, not formally tested                                         ADL either performed or assessed with clinical judgement   ADL Overall ADL's : Needs assistance/impaired Eating/Feeding: Set up;Sitting   Grooming: Set up;Sitting   Upper Body Bathing: Minimal assistance;Sitting   Lower Body Bathing: Moderate assistance;Sit to/from stand   Upper Body Dressing : Moderate assistance;Maximal assistance;Sitting Upper Body Dressing Details (indicate cue type and reason): modA donning button up shirt, maxA sling management Lower Body Dressing: Moderate assistance;Sit to/from stand Lower Body Dressing Details (indicate cue type and reason): donning pants Toilet Transfer: Min guard;Ambulation Toilet Transfer Details (indicate cue type and reason): simulated via transfer to recliner Toileting- Clothing Manipulation and Hygiene: Minimal assistance;Sit to/from Nurse, children's Details (indicate cue type and reason): discussed with pt's daughter option of using shower chair if needed for increased safety, pt/pt's daughter verbalizing understanding Functional mobility during ADLs: Min guard General ADL Comments: pt does require increased cues for adhering/following precautions, partly due to  language barrier                         Pertinent Vitals/Pain Pain Assessment: Faces Faces Pain Scale:  Hurts little more Pain Location: RUE Pain Descriptors / Indicators: Discomfort;Sore;Guarding(heavy due to nerve block) Pain Intervention(s): Limited activity within patient's tolerance;Monitored during session;Repositioned     Hand Dominance Right   Extremity/Trunk Assessment Upper Extremity Assessment Upper Extremity Assessment: RUE deficits/detail RUE Deficits / Details: s/p reverse TSA RUE: Unable to fully assess due to immobilization RUE Sensation: decreased light touch(due to nerve block) RUE Coordination: decreased gross motor   Lower Extremity Assessment Lower Extremity Assessment: Overall WFL for tasks assessed       Communication Communication Communication: Prefers language other than English(daughter assisted to interpret Michael Burch))   Cognition Arousal/Alertness: Awake/alert Behavior During Therapy: WFL for tasks assessed/performed Overall Cognitive Status: Within Functional Limits for tasks assessed                                 General Comments: appears WFL, pt non-english speaking but overall following commands well   General Comments       Exercises Exercises: Shoulder;Hand exercises Shoulder Exercises Shoulder Flexion: AAROM;Right;Supine;Seated(0-90*) Shoulder ABduction: AAROM;Right;5 reps;Seated(0-60*) Shoulder External Rotation: AAROM;Right;5 reps(0-30*) Elbow Flexion: AAROM;Right;5 reps;Seated Elbow Extension: AAROM;Right;5 reps;Seated Wrist Flexion: AROM;Right;10 reps;Seated Wrist Extension: AROM;Right;10 reps;Seated Digit Composite Flexion: AROM;Right;10 reps;Seated Composite Extension: AROM;Right;10 reps;Seated Neck Flexion: AROM;Seated Neck Extension: AROM;Seated Neck Lateral Flexion - Right: AROM;Seated Neck Lateral Flexion - Left: AROM;Seated Hand Exercises Forearm Supination: AAROM;Right;5 reps;Seated Forearm Pronation: AAROM;5 reps;Right;Seated   Shoulder Instructions Shoulder Instructions Donning/doffing shirt without  moving shoulder: Moderate assistance;Caregiver independent with task Method for sponge bathing under operated UE: Min-guard;Caregiver independent with task Donning/doffing sling/immobilizer: Maximal assistance;Caregiver independent with task Correct positioning of sling/immobilizer: Minimal assistance;Caregiver independent with task ROM for elbow, wrist and digits of operated UE: Min-guard Sling wearing schedule (on at all times/off for ADL's): Min-guard Proper positioning of operated UE when showering: Min-guard;Caregiver independent with task Positioning of UE while sleeping: Minimal assistance;Caregiver independent with task    Home Living Family/patient expects to be discharged to:: Private residence Living Arrangements: Spouse/significant other;Children Available Help at Discharge: Family;Available 24 hours/day Type of Home: House             Bathroom Shower/Tub: Walk-in Psychologist, prison and probation services: Standard     Home Equipment: Shower seat          Prior Functioning/Environment Level of Independence: Independent                 OT Problem List: Decreased range of motion;Decreased strength;Decreased activity tolerance;Impaired balance (sitting and/or standing);Decreased knowledge of precautions;Impaired UE functional use;Pain      OT Treatment/Interventions:      OT Goals(Current goals can be found in the care plan section) Acute Rehab OT Goals Patient Stated Goal: home today OT Goal Formulation: All assessment and education complete, DC therapy  OT Frequency:     Barriers to D/C:            Co-evaluation              AM-PAC OT "6 Clicks" Daily Activity     Outcome Measure Help from another person eating meals?: A Little Help from another person taking care of personal grooming?: A Little Help from another person toileting, which includes using toliet, bedpan, or urinal?: A Little Help from  another person bathing (including washing, rinsing,  drying)?: A Lot Help from another person to put on and taking off regular upper body clothing?: A Lot Help from another person to put on and taking off regular lower body clothing?: A Lot 6 Click Score: 15   End of Session Equipment Utilized During Treatment: Other (comment)(sling) Nurse Communication: Mobility status  Activity Tolerance: Patient tolerated treatment well Patient left: in chair;with call bell/phone within reach  OT Visit Diagnosis: Muscle weakness (generalized) (M62.81);Other (comment)(s/p reverse R TSA)                Time: 1010-1043 OT Time Calculation (min): 33 min Charges:  OT General Charges $OT Visit: 1 Visit OT Evaluation $OT Eval Low Complexity: 1 Low OT Treatments $Self Care/Home Management : 8-22 mins  Lou Cal, OT Supplemental Rehabilitation Services Pager [phone] Office [phone]   Raymondo Band 08/26/2019, 1:01 PM

## 2019-08-26 NOTE — Progress Notes (Signed)
Orthopedics Progress Note  Subjective:Patient doing well this morning. No pain due to block  Objective:  Vitals:   08/26/19 0032 08/26/19 0516  BP: 95/61 (!) 99/53  Pulse: 82 76  Resp: 16 16  Temp: 98.1 F (36.7 C) 98.1 F (36.7 C)  SpO2: 96% 98%    General: Awake and alert  Musculoskeletal: right shoulder dressing changed, wound benign Neurovascularly intact  Lab Results  Component Value Date   WBC 6.2 08/21/2019   HGB 11.4 (L) 08/26/2019   HCT 33.9 (L) 08/26/2019   MCV 95.9 08/21/2019   PLT 173 08/21/2019       Component Value Date/Time   NA 135 08/26/2019 0306   K 4.0 08/26/2019 0306   CL 105 08/26/2019 0306   CO2 23 08/26/2019 0306   GLUCOSE 160 (H) 08/26/2019 0306   BUN 16 08/26/2019 0306   CREATININE 1.07 08/26/2019 0306   CALCIUM 8.8 (L) 08/26/2019 0306   GFRNONAA >60 08/26/2019 0306   GFRAA >60 08/26/2019 0306    No results found for: INR, PROTIME  Assessment/Plan: POD #1 s/p Procedure(s): REVERSE TOTAL SHOULDER ARTHROPLASTY Stable this morning, doing well. OT for shoulder exercises and ADL instruction. Active protocol Discharge later this morning Follow up in two weeks in the office  Remo Lipps R. Veverly Fells, MD 08/26/2019 8:03 AM

## 2019-08-27 DIAGNOSIS — U071 COVID-19: Secondary | ICD-10-CM

## 2019-08-27 HISTORY — DX: COVID-19: U07.1

## 2019-08-29 ENCOUNTER — Encounter (HOSPITAL_COMMUNITY): Payer: Self-pay | Admitting: Orthopedic Surgery

## 2020-03-28 ENCOUNTER — Encounter: Payer: Self-pay | Admitting: *Deleted

## 2020-04-01 ENCOUNTER — Encounter: Payer: Self-pay | Admitting: Diagnostic Neuroimaging

## 2020-04-01 ENCOUNTER — Other Ambulatory Visit: Payer: Self-pay

## 2020-04-01 ENCOUNTER — Ambulatory Visit: Payer: Medicare Other | Admitting: Diagnostic Neuroimaging

## 2020-04-01 VITALS — BP 120/75 | HR 65 | Ht 62.0 in | Wt 122.4 lb

## 2020-04-01 DIAGNOSIS — G629 Polyneuropathy, unspecified: Secondary | ICD-10-CM | POA: Diagnosis not present
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
## 2020-04-01 NOTE — Patient Instructions (Signed)
neuropathy pain (low B12 level) - check other neuropathy labs - continue gabapentin 300mg  at bedtime; may increase to 600mg  at bedtime - try sleeping in different room or bed - try over the counter lidocaine or capsaicin cream - try over the counter alpha-lipoic acid and multi-vitamin tablet

## 2020-04-05 LAB — MULTIPLE MYELOMA PANEL, SERUM
Albumin SerPl Elph-Mcnc: 3.7 g/dL (ref 2.9–4.4)
Albumin/Glob SerPl: 1.3 (ref 0.7–1.7)
Alpha 1: 0.2 g/dL (ref 0.0–0.4)
Alpha2 Glob SerPl Elph-Mcnc: 0.7 g/dL (ref 0.4–1.0)
B-Globulin SerPl Elph-Mcnc: 1 g/dL (ref 0.7–1.3)
Gamma Glob SerPl Elph-Mcnc: 1.1 g/dL (ref 0.4–1.8)
Globulin, Total: 3 g/dL (ref 2.2–3.9)
IgA/Immunoglobulin A, Serum: 141 mg/dL (ref 61–437)
IgG (Immunoglobin G), Serum: 758 mg/dL (ref 603–1613)
IgM (Immunoglobulin M), Srm: 514 mg/dL — ABNORMAL HIGH (ref 15–143)

## 2020-04-05 LAB — COMPREHENSIVE METABOLIC PANEL
ALT: 14 IU/L (ref 0–44)
AST: 24 IU/L (ref 0–40)
Albumin/Globulin Ratio: 1.8 (ref 1.2–2.2)
Albumin: 4.3 g/dL (ref 3.7–4.7)
Alkaline Phosphatase: 93 IU/L (ref 48–121)
BUN/Creatinine Ratio: 14 (ref 10–24)
BUN: 17 mg/dL (ref 8–27)
Bilirubin Total: 0.5 mg/dL (ref 0.0–1.2)
CO2: 24 mmol/L (ref 20–29)
Calcium: 9.9 mg/dL (ref 8.6–10.2)
Chloride: 104 mmol/L (ref 96–106)
Creatinine, Ser: 1.19 mg/dL (ref 0.76–1.27)
GFR calc Af Amer: 68 mL/min/{1.73_m2} (ref >59)
GFR calc non Af Amer: 59 mL/min/{1.73_m2} — ABNORMAL LOW (ref >59)
Globulin, Total: 2.4 g/dL (ref 1.5–4.5)
Glucose: 85 mg/dL (ref 65–99)
Potassium: 4.3 mmol/L (ref 3.5–5.2)
Sodium: 141 mmol/L (ref 134–144)
Total Protein: 6.7 g/dL (ref 6.0–8.5)

## 2020-04-05 LAB — PAN-ANCA
ANCA Proteinase 3: <3.5 U/mL (ref 0.0–3.5)
Atypical pANCA: <1:20 {titer}
C-ANCA: <1:20 {titer}
Myeloperoxidase Ab: <9 U/mL (ref 0.0–9.0)
P-ANCA: <1:20 {titer}

## 2020-04-05 LAB — SJOGREN'S SYNDROME ANTIBODS(SSA + SSB)
ENA SSA (RO) Ab: <0.2 AI (ref 0.0–0.9)
ENA SSB (LA) Ab: <0.2 AI (ref 0.0–0.9)

## 2020-04-05 LAB — CBC WITH DIFFERENTIAL/PLATELET
Basophils Absolute: 0 10*3/uL (ref 0.0–0.2)
Basos: 1 %
EOS (ABSOLUTE): 0 10*3/uL (ref 0.0–0.4)
Eos: 1 %
Hematocrit: 40.1 % (ref 37.5–51.0)
Hemoglobin: 13.7 g/dL (ref 13.0–17.7)
Immature Grans (Abs): 0 10*3/uL (ref 0.0–0.1)
Immature Granulocytes: 1 %
Lymphocytes Absolute: 1.6 10*3/uL (ref 0.7–3.1)
Lymphs: 32 %
MCH: 31 pg (ref 26.6–33.0)
MCHC: 34.2 g/dL (ref 31.5–35.7)
MCV: 91 fL (ref 79–97)
Monocytes Absolute: 0.5 10*3/uL (ref 0.1–0.9)
Monocytes: 10 %
Neutrophils Absolute: 2.8 10*3/uL (ref 1.4–7.0)
Neutrophils: 55 %
Platelets: 155 10*3/uL (ref 150–450)
RBC: 4.42 x10E6/uL (ref 4.14–5.80)
RDW: 14 % (ref 11.6–15.4)
WBC: 5 10*3/uL (ref 3.4–10.8)

## 2020-04-05 LAB — VITAMIN B12: Vitamin B-12: 1090 pg/mL (ref 232–1245)

## 2020-04-05 LAB — ANA W/REFLEX: ANA Titer 1: NEGATIVE

## 2020-04-05 LAB — TSH: TSH: 2.25 u[IU]/mL (ref 0.450–4.500)

## 2020-04-05 LAB — HIV ANTIBODY (ROUTINE TESTING W REFLEX): HIV Screen 4th Generation wRfx: NONREACTIVE

## 2020-04-05 LAB — METHYLMALONIC ACID, SERUM: Methylmalonic Acid: 151 nmol/L (ref 0–378)

## 2020-04-05 LAB — HOMOCYSTEINE: Homocysteine: 14.4 umol/L (ref 0.0–19.2)

## 2020-04-05 LAB — HEPATITIS B SURFACE ANTIBODY,QUALITATIVE: Hep B Surface Ab, Qual: NONREACTIVE

## 2020-04-05 LAB — HEMOGLOBIN A1C
Est. average glucose Bld gHb Est-mCnc: 114 mg/dL
Hgb A1c MFr Bld: 5.6 % (ref 4.8–5.6)

## 2020-04-05 LAB — HEPATITIS B SURFACE ANTIGEN: Hepatitis B Surface Ag: NEGATIVE

## 2020-04-05 LAB — RPR: RPR Ser Ql: NONREACTIVE

## 2020-04-05 LAB — HEPATITIS B CORE ANTIBODY, TOTAL: Hep B Core Total Ab: NEGATIVE

## 2020-04-05 LAB — C-REACTIVE PROTEIN: CRP: <1 mg/L (ref 0–10)

## 2020-04-05 LAB — SEDIMENTATION RATE: Sed Rate: 7 mm/hr (ref 0–30)

## 2020-04-05 LAB — HEPATITIS C ANTIBODY: Hep C Virus Ab: <0.1 s/co ratio (ref 0.0–0.9)

## 2020-04-05 LAB — VITAMIN B1: Thiamine: 106.6 nmol/L (ref 66.5–200.0)

## 2020-04-05 LAB — VITAMIN B6: Vitamin B6: 8.7 ug/L (ref 5.3–46.7)

## 2020-04-10 ENCOUNTER — Encounter: Payer: Self-pay | Admitting: *Deleted

## 2020-04-16 ENCOUNTER — Telehealth: Payer: Self-pay | Admitting: *Deleted

## 2020-04-16 NOTE — Telephone Encounter (Signed)
Called daughter, Curley Spice on [place] and informed her the labs came back unremarkable. Advised he can try Dr Gladstone Lighter  recommendations per office note. She asked that they be sent to him in my chart, this was done. She verbalized understanding, appreciation.

## 2020-11-05 DIAGNOSIS — J069 Acute upper respiratory infection, unspecified: Secondary | ICD-10-CM | POA: Diagnosis not present

## 2020-11-20 DIAGNOSIS — M1711 Unilateral primary osteoarthritis, right knee: Secondary | ICD-10-CM | POA: Diagnosis not present

## 2020-11-20 DIAGNOSIS — M25511 Pain in right shoulder: Secondary | ICD-10-CM | POA: Diagnosis not present

## 2020-11-20 DIAGNOSIS — M170 Bilateral primary osteoarthritis of knee: Secondary | ICD-10-CM | POA: Diagnosis not present

## 2020-11-22 DIAGNOSIS — Z1389 Encounter for screening for other disorder: Secondary | ICD-10-CM | POA: Diagnosis not present

## 2020-11-22 DIAGNOSIS — Z0000 Encounter for general adult medical examination without abnormal findings: Secondary | ICD-10-CM | POA: Diagnosis not present

## 2020-11-22 DIAGNOSIS — E7800 Pure hypercholesterolemia, unspecified: Secondary | ICD-10-CM | POA: Diagnosis not present

## 2020-11-22 DIAGNOSIS — G629 Polyneuropathy, unspecified: Secondary | ICD-10-CM | POA: Diagnosis not present

## 2020-11-22 DIAGNOSIS — R7301 Impaired fasting glucose: Secondary | ICD-10-CM | POA: Diagnosis not present

## 2021-01-29 DIAGNOSIS — H2513 Age-related nuclear cataract, bilateral: Secondary | ICD-10-CM | POA: Diagnosis not present

## 2021-01-29 DIAGNOSIS — H25013 Cortical age-related cataract, bilateral: Secondary | ICD-10-CM | POA: Diagnosis not present

## 2021-01-29 DIAGNOSIS — H5203 Hypermetropia, bilateral: Secondary | ICD-10-CM | POA: Diagnosis not present

## 2021-01-29 DIAGNOSIS — H524 Presbyopia: Secondary | ICD-10-CM | POA: Diagnosis not present

## 2021-01-29 DIAGNOSIS — H52203 Unspecified astigmatism, bilateral: Secondary | ICD-10-CM | POA: Diagnosis not present

## 2021-04-22 DIAGNOSIS — J019 Acute sinusitis, unspecified: Secondary | ICD-10-CM | POA: Diagnosis not present

## 2021-04-22 DIAGNOSIS — S90851A Superficial foreign body, right foot, initial encounter: Secondary | ICD-10-CM | POA: Diagnosis not present

## 2021-04-24 DIAGNOSIS — M25561 Pain in right knee: Secondary | ICD-10-CM | POA: Diagnosis not present

## 2021-04-24 DIAGNOSIS — M13861 Other specified arthritis, right knee: Secondary | ICD-10-CM | POA: Diagnosis not present

## 2021-05-14 ENCOUNTER — Ambulatory Visit: Payer: Medicare Other | Admitting: Cardiology

## 2021-05-14 ENCOUNTER — Other Ambulatory Visit: Payer: Self-pay

## 2021-05-14 ENCOUNTER — Encounter: Payer: Self-pay | Admitting: Cardiology

## 2021-05-14 ENCOUNTER — Ambulatory Visit
Admission: RE | Admit: 2021-05-14 | Discharge: 2021-05-14 | Disposition: A | Discharge status: Home / Self Care | Payer: Medicare Other | Source: Ambulatory Visit | Attending: Cardiology | Admitting: Cardiology

## 2021-05-14 VITALS — BP 134/84 | HR 58 | Temp 98.3°F | Resp 16 | Ht 62.0 in | Wt 120.0 lb

## 2021-05-14 DIAGNOSIS — Z01810 Encounter for preprocedural cardiovascular examination: Secondary | ICD-10-CM | POA: Diagnosis not present

## 2021-05-14 DIAGNOSIS — R0989 Other specified symptoms and signs involving the circulatory and respiratory systems: Secondary | ICD-10-CM

## 2021-05-14 DIAGNOSIS — E7800 Pure hypercholesterolemia, unspecified: Secondary | ICD-10-CM | POA: Diagnosis not present
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
## 2021-05-20 DIAGNOSIS — M1711 Unilateral primary osteoarthritis, right knee: Secondary | ICD-10-CM | POA: Diagnosis not present

## 2021-05-20 NOTE — Progress Notes (Signed)
Sent message, via epic in basket, requesting orders in epic from surgeon.  
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
## 2021-05-26 NOTE — Patient Instructions (Addendum)
DUE TO COVID-19 ONLY ONE VISITOR IS ALLOWED TO COME WITH YOU AND STAY IN THE WAITING ROOM ONLY DURING PRE OP AND PROCEDURE DAY OF SURGERY. THE 2 VISITORS  MAY VISIT WITH YOU AFTER SURGERY IN YOUR PRIVATE ROOM DURING VISITING HOURS ONLY!  YOU NEED TO HAVE A COVID 19 TEST ON__8/10_____ '@_[address]  between 8:00 am - 3:00 pm______, THIS TEST MUST BE DONE BEFORE SURGERY,                 Larin Brallier     Your procedure is scheduled on: 06/06/21   Report to [facility] Main  Entrance   Report to admitting at 1:00 PM     Call this number if you have problems the morning of surgery [place], [place].   No food after midnight.    You may have clear liquid until 12:30 PM  CLEAR LIQUID DIET   Foods Allowed                                                                     Foods Excluded  Coffee and tea, regular and decaf                             liquids that you cannot  Plain Jell-O any favor except red or purple                                           see through such as: Fruit ices (not with fruit pulp)                                     milk, soups, orange juice  Iced Popsicles                                    All solid food Carbonated beverages, regular and diet                                    Cranberry, grape and apple juices Sports drinks like Gatorade Lightly seasoned clear broth or consume(fat free) Sugar, honey syrup    At 12:00 PM drink pre surgery drink.   Nothing by mouth after 12:30 pm    Take these medicines the morning of surgery with A SIP OF WATER: NONE                                You may not have any metal on your body including              piercings  Do not wear jewelry, lotions, powders or deodorant  Men may shave face and neck.   Do not bring valuables to the hospital. [place].  Contacts, dentures or bridgework may not be worn into surgery.                  [facility] - Preparing for Surgery Before surgery, you can play an important role.  Because skin is not sterile, your skin needs to be as free of germs as possible.  You can reduce the number of germs on your skin by washing with CHG (chlorahexidine gluconate) soap before surgery.  CHG is an antiseptic cleaner which kills germs and bonds with the skin to continue killing germs even after washing. Please DO NOT use if you have an allergy to CHG or antibacterial soaps.  If your skin becomes reddened/irritated stop using the CHG and inform your nurse when you arrive at Short Stay.   You may shave your face/neck.  Please follow these instructions carefully:  1.  Shower with CHG Soap the night before surgery and the  morning of Surgery.  2.  If you choose to wash your hair, wash your hair first as usual with your  normal  shampoo.  3.  After you shampoo, rinse your hair and body thoroughly to remove the  shampoo.                                       4.  Use CHG as you would any other liquid soap.  You can apply chg directly  to the skin and wash                       Gently with a scrungie or clean washcloth.  5.  Apply the CHG Soap to your body ONLY FROM THE NECK DOWN.   Do not use on face/ open                           Wound or open sores. Avoid contact with eyes, ears mouth and genitals (private parts).                       Wash face,  Genitals (private parts) with your normal soap.             6.  Wash thoroughly, paying special attention to the area where your surgery  will be performed.  7.  Thoroughly rinse your body with warm water from the neck down.  8.  DO NOT shower/wash with your normal soap after using and rinsing off  the CHG Soap.             9.  Pat yourself dry with a clean towel.            10.  Wear clean pajamas.            11.  Place clean sheets on your bed the  night of your first shower and do not  sleep with pets. Day of Surgery : Do not apply any lotions/deodorants the morning of surgery.  Please wear clean clothes to the hospital/surgery center.  FAILURE TO FOLLOW THESE INSTRUCTIONS MAY RESULT IN THE CANCELLATION OF YOUR SURGERY PATIENT SIGNATURE_________________________________  NURSE SIGNATURE__________________________________  ________________________________________________________________________  Incentive Spirometer  An incentive spirometer is a tool that can help keep your lungs clear and active. This tool measures how well you are filling your lungs with each breath. Taking long deep breaths may help reverse or decrease the chance of developing breathing (pulmonary) problems (especially infection) following: A long period of time when you are unable to move or be active. BEFORE THE PROCEDURE  If the spirometer includes an indicator to show your best effort, your nurse or respiratory therapist will set it to a desired goal. If possible, sit up straight or lean slightly forward. Try not to slouch. Hold the incentive spirometer in an upright position. INSTRUCTIONS FOR USE  Sit on the edge of your bed if possible, or sit up as far as you can in bed or on a chair. Hold the incentive spirometer in an upright position. Breathe out normally. Place the mouthpiece in your mouth and seal your lips tightly around it. Breathe in slowly and as deeply as possible, raising the piston or the ball toward the top of the column. Hold your breath for 3-5 seconds or for as long as possible. Allow the piston or ball to fall to the bottom of the column. Remove the mouthpiece from your mouth and breathe out normally. Rest for a few seconds and repeat Steps 1 through 7 at least 10 times every 1-2 hours when you are awake. Take your time and take a few normal breaths between deep breaths. The spirometer may include an indicator to show your best effort. Use  the indicator as a goal to work toward during each repetition. After each set of 10 deep breaths, practice coughing to be sure your lungs are clear. If you have an incision (the cut made at the time of surgery), support your incision when coughing by placing a pillow or rolled up towels firmly against it. Once you are able to get out of bed, walk around indoors and cough well. You may stop using the incentive spirometer when instructed by your caregiver.  RISKS AND COMPLICATIONS Take your time so you do not get dizzy or light-headed. If you are in pain, you may need to take or ask for pain medication before doing incentive spirometry. It is harder to take a deep breath if you are having pain. AFTER USE Rest and breathe slowly and easily. It can be helpful to keep track of a log of your progress. Your caregiver can provide you with a simple table to help with this. If you are using the spirometer at home, follow these instructions: [place] IF:  You are having difficultly using the spirometer. You have trouble using the spirometer as often as instructed. Your pain medication is not giving enough relief while using the spirometer. You develop fever of 100.5 F (38.1 C) or higher. SEEK IMMEDIATE MEDICAL CARE IF:  You cough up bloody sputum that had not been present before. You develop fever of 102 F (38.9 C) or greater. You develop worsening pain at or near the incision site. MAKE SURE YOU:  Understand these instructions. Will watch your condition. Will get help right away if you are not doing well or get worse. Document Released: 02/22/2007 Document Revised: 01/04/2012 Document Reviewed: 04/25/2007 [facility] Patient Information 2014 [place], [place].   ________________________________________________________________________

## 2021-05-27 ENCOUNTER — Encounter (HOSPITAL_COMMUNITY): Payer: Self-pay

## 2021-05-27 ENCOUNTER — Other Ambulatory Visit: Payer: Self-pay

## 2021-05-27 ENCOUNTER — Encounter (HOSPITAL_COMMUNITY)
Admission: RE | Admit: 2021-05-27 | Discharge: 2021-05-27 | Disposition: A | Discharge status: Home / Self Care | Payer: Medicare Other | Source: Ambulatory Visit | Attending: Orthopedic Surgery | Admitting: Orthopedic Surgery

## 2021-05-27 DIAGNOSIS — Z01812 Encounter for preprocedural laboratory examination: Secondary | ICD-10-CM | POA: Diagnosis not present

## 2021-05-27 LAB — BASIC METABOLIC PANEL
Anion gap: 6 (ref 5–15)
BUN: 16 mg/dL (ref 8–23)
CO2: 27 mmol/L (ref 22–32)
Calcium: 9.7 mg/dL (ref 8.9–10.3)
Chloride: 104 mmol/L (ref 98–111)
Creatinine, Ser: 1.09 mg/dL (ref 0.61–1.24)
GFR, Estimated: >60 mL/min (ref >60)
Glucose, Bld: 82 mg/dL (ref 70–99)
Potassium: 4.2 mmol/L (ref 3.5–5.1)
Sodium: 137 mmol/L (ref 135–145)

## 2021-05-27 LAB — CBC
HCT: 38.6 % — ABNORMAL LOW (ref 39.0–52.0)
Hemoglobin: 12.9 g/dL — ABNORMAL LOW (ref 13.0–17.0)
MCH: 31.5 pg (ref 26.0–34.0)
MCHC: 33.4 g/dL (ref 30.0–36.0)
MCV: 94.1 fL (ref 80.0–100.0)
Platelets: 154 10*3/uL (ref 150–400)
RBC: 4.1 MIL/uL — ABNORMAL LOW (ref 4.22–5.81)
RDW: 12.8 % (ref 11.5–15.5)
WBC: 5.3 10*3/uL (ref 4.0–10.5)
nRBC: 0 % (ref 0.0–0.2)

## 2021-05-27 LAB — SURGICAL PCR SCREEN
MRSA, PCR: NEGATIVE
Staphylococcus aureus: NEGATIVE

## 2021-05-27 NOTE — Progress Notes (Signed)
COVID Vaccine Completed:Yes Date COVID Vaccine completed:11/20/19 COVID vaccine manufacturer: Pfizer      PCP - Dr. Oneida Arenas LOV 11/22/20-epic Cardiologist - Dr. Christen Butter  Chest x-ray - 05/14/21-epic EKG - 05/14/21-epic Stress Test - 02/21/10-epic ECHO - 02/18/10-epic Cardiac Cath - no Pacemaker/ICD device last checked:NA  Sleep Study - no CPAP -   Fasting Blood Sugar - NA Checks Blood Sugar _____ times a day  Blood Thinner Instructions:NA Aspirin Instructions: Last Dose:  Anesthesia review: yes  Patient denies shortness of breath, fever, cough and chest pain at PAT appointment Pt had his daughter as the interpreter. She and her family will interpret for him throughout his stay.He has no SOB with activities.  Patient verbalized understanding of instructions that were given to them at the PAT appointment. Patient was also instructed that they will need to review over the PAT instructions again at home before surgery. Alfred [place]ins We reviewed what clear liquids are. The daughter wanted to give him rice water. I told her that wasn't a clear liquid.

## 2021-06-04 ENCOUNTER — Other Ambulatory Visit: Payer: Self-pay | Admitting: Orthopedic Surgery

## 2021-06-04 LAB — SARS CORONAVIRUS 2 (TAT 6-24 HRS): SARS Coronavirus 2: NEGATIVE

## 2021-06-05 MED ORDER — BUPIVACAINE LIPOSOME 1.3 % IJ SUSP
20.0000 mL | Freq: Once | INTRAMUSCULAR | Status: DC
  Filled 2021-06-05: qty 20

## 2021-06-06 ENCOUNTER — Ambulatory Visit (HOSPITAL_COMMUNITY): Payer: Medicare Other | Admitting: Anesthesiology

## 2021-06-06 ENCOUNTER — Encounter (HOSPITAL_COMMUNITY)
Admission: RE | Disposition: A | Discharge status: Home / Self Care | Payer: Self-pay | Source: Home / Self Care | Attending: Orthopedic Surgery

## 2021-06-06 ENCOUNTER — Ambulatory Visit (HOSPITAL_COMMUNITY): Payer: Medicare Other | Admitting: Physician Assistant

## 2021-06-06 ENCOUNTER — Other Ambulatory Visit: Payer: Self-pay

## 2021-06-06 ENCOUNTER — Observation Stay (HOSPITAL_COMMUNITY)
Admission: RE | Admit: 2021-06-06 | Discharge: 2021-06-07 | Disposition: A | Discharge status: Home / Self Care | Payer: Medicare Other | Attending: Orthopedic Surgery | Admitting: Orthopedic Surgery

## 2021-06-06 ENCOUNTER — Encounter (HOSPITAL_COMMUNITY): Payer: Self-pay | Admitting: Orthopedic Surgery

## 2021-06-06 DIAGNOSIS — M1711 Unilateral primary osteoarthritis, right knee: Principal | ICD-10-CM | POA: Insufficient documentation

## 2021-06-06 DIAGNOSIS — K219 Gastro-esophageal reflux disease without esophagitis: Secondary | ICD-10-CM | POA: Diagnosis not present

## 2021-06-06 DIAGNOSIS — Z8616 Personal history of COVID-19: Secondary | ICD-10-CM | POA: Insufficient documentation

## 2021-06-06 DIAGNOSIS — Z96652 Presence of left artificial knee joint: Secondary | ICD-10-CM | POA: Diagnosis not present

## 2021-06-06 DIAGNOSIS — E7800 Pure hypercholesterolemia, unspecified: Secondary | ICD-10-CM | POA: Diagnosis not present

## 2021-06-06 DIAGNOSIS — Z96651 Presence of right artificial knee joint: Secondary | ICD-10-CM

## 2021-06-06 DIAGNOSIS — G8918 Other acute postprocedural pain: Secondary | ICD-10-CM | POA: Diagnosis not present

## 2021-06-06 HISTORY — PX: TOTAL KNEE ARTHROPLASTY: SHX125

## 2021-06-06 HISTORY — DX: Paralytic syndrome, unspecified: G83.9

## 2021-06-06 SURGERY — ARTHROPLASTY, KNEE, TOTAL
Anesthesia: General | Site: Knee | Laterality: Right

## 2021-06-06 MED ORDER — POVIDONE-IODINE 10 % EX SWAB: 2.0000 "application " | Freq: Once | CUTANEOUS | Status: DC

## 2021-06-06 MED ORDER — ONDANSETRON HCL 4 MG/2ML IJ SOLN: 4.0000 mg | Freq: Four times a day (QID) | INTRAMUSCULAR | Status: DC | PRN

## 2021-06-06 MED ORDER — BUPIVACAINE-EPINEPHRINE (PF) 0.25% -1:200000 IJ SOLN
INTRAMUSCULAR | Status: AC
  Filled 2021-06-06: qty 30

## 2021-06-06 MED ORDER — TRANEXAMIC ACID-NACL 1000-0.7 MG/100ML-% IV SOLN
1000.0000 mg | INTRAVENOUS | Status: AC
  Administered 2021-06-06: 1000 mg via INTRAVENOUS
  Filled 2021-06-06: qty 100

## 2021-06-06 MED ORDER — ONDANSETRON HCL 4 MG PO TABS: 4.0000 mg | ORAL_TABLET | Freq: Every day | ORAL | 1 refills | Status: AC | PRN

## 2021-06-06 MED ORDER — ACETAMINOPHEN 325 MG PO TABS: 325.0000 mg | ORAL_TABLET | Freq: Four times a day (QID) | ORAL | Status: DC | PRN

## 2021-06-06 MED ORDER — SODIUM CHLORIDE (PF) 0.9 % IJ SOLN
INTRAMUSCULAR | Status: DC | PRN
  Administered 2021-06-06: 30 mL

## 2021-06-06 MED ORDER — FENTANYL CITRATE (PF) 100 MCG/2ML IJ SOLN
INTRAMUSCULAR | Status: AC
  Administered 2021-06-06: 50 ug via INTRAVENOUS
  Filled 2021-06-06: qty 2

## 2021-06-06 MED ORDER — DEXAMETHASONE SODIUM PHOSPHATE 10 MG/ML IJ SOLN
INTRAMUSCULAR | Status: AC
  Filled 2021-06-06: qty 1

## 2021-06-06 MED ORDER — FENTANYL CITRATE (PF) 100 MCG/2ML IJ SOLN
50.0000 ug | INTRAMUSCULAR | Status: DC
  Administered 2021-06-06: 50 ug via INTRAVENOUS
  Filled 2021-06-06: qty 2

## 2021-06-06 MED ORDER — CEFAZOLIN SODIUM-DEXTROSE 2-4 GM/100ML-% IV SOLN
2.0000 g | INTRAVENOUS | Status: AC
  Administered 2021-06-06: 2 g via INTRAVENOUS
  Filled 2021-06-06: qty 100

## 2021-06-06 MED ORDER — SODIUM CHLORIDE 0.9 % IR SOLN
Status: DC | PRN
  Administered 2021-06-06: 1000 mL

## 2021-06-06 MED ORDER — ONDANSETRON HCL 4 MG/2ML IJ SOLN: 4.0000 mg | Freq: Once | INTRAMUSCULAR | Status: DC | PRN

## 2021-06-06 MED ORDER — BUPIVACAINE IN DEXTROSE 0.75-8.25 % IT SOLN
INTRATHECAL | Status: DC | PRN
  Administered 2021-06-06: 1.4 mL via INTRATHECAL

## 2021-06-06 MED ORDER — SODIUM CHLORIDE 0.9 % IV SOLN: INTRAVENOUS | Status: DC

## 2021-06-06 MED ORDER — METHOCARBAMOL 500 MG IVPB - SIMPLE MED
500.0000 mg | Freq: Four times a day (QID) | INTRAVENOUS | Status: DC | PRN
  Filled 2021-06-06: qty 50

## 2021-06-06 MED ORDER — CEFAZOLIN SODIUM-DEXTROSE 2-4 GM/100ML-% IV SOLN
2.0000 g | Freq: Four times a day (QID) | INTRAVENOUS | Status: AC
  Administered 2021-06-06 – 2021-06-07 (×2): 2 g via INTRAVENOUS
  Filled 2021-06-06 (×2): qty 100

## 2021-06-06 MED ORDER — OXYCODONE HCL 5 MG PO TABS
5.0000 mg | ORAL_TABLET | ORAL | Status: DC | PRN
  Administered 2021-06-06: 10 mg via ORAL
  Administered 2021-06-07: 5 mg via ORAL
  Administered 2021-06-07: 10 mg via ORAL
  Administered 2021-06-07: 5 mg via ORAL
  Filled 2021-06-06: qty 1
  Filled 2021-06-06 (×2): qty 2
  Filled 2021-06-06: qty 1

## 2021-06-06 MED ORDER — WATER FOR IRRIGATION, STERILE IR SOLN
Status: DC | PRN
  Administered 2021-06-06: 2000 mL

## 2021-06-06 MED ORDER — ROPIVACAINE HCL 5 MG/ML IJ SOLN
INTRAMUSCULAR | Status: DC | PRN
  Administered 2021-06-06: 20 mL via PERINEURAL

## 2021-06-06 MED ORDER — PHENOL 1.4 % MT LIQD: 1.0000 | OROMUCOSAL | Status: DC | PRN

## 2021-06-06 MED ORDER — POLYETHYLENE GLYCOL 3350 17 G PO PACK: 17.0000 g | PACK | Freq: Every day | ORAL | Status: DC | PRN

## 2021-06-06 MED ORDER — ASPIRIN 81 MG PO CHEW: 81.0000 mg | CHEWABLE_TABLET | Freq: Two times a day (BID) | ORAL | 0 refills | Status: AC

## 2021-06-06 MED ORDER — SODIUM CHLORIDE (PF) 0.9 % IJ SOLN
INTRAMUSCULAR | Status: AC
  Filled 2021-06-06: qty 10

## 2021-06-06 MED ORDER — MIDAZOLAM HCL 2 MG/2ML IJ SOLN
1.0000 mg | INTRAMUSCULAR | Status: DC
Start: 2021-06-06 — End: 2021-06-06
  Filled 2021-06-06: qty 2

## 2021-06-06 MED ORDER — ORAL CARE MOUTH RINSE: 15.0000 mL | Freq: Once | OROMUCOSAL | Status: AC

## 2021-06-06 MED ORDER — METOCLOPRAMIDE HCL 5 MG PO TABS: 5.0000 mg | ORAL_TABLET | Freq: Three times a day (TID) | ORAL | Status: DC | PRN

## 2021-06-06 MED ORDER — DEXAMETHASONE SODIUM PHOSPHATE 10 MG/ML IJ SOLN
INTRAMUSCULAR | Status: DC | PRN
  Administered 2021-06-06: 10 mg via INTRAVENOUS

## 2021-06-06 MED ORDER — MENTHOL 3 MG MT LOZG: 1.0000 | LOZENGE | OROMUCOSAL | Status: DC | PRN

## 2021-06-06 MED ORDER — PROPOFOL 500 MG/50ML IV EMUL
INTRAVENOUS | Status: DC | PRN
  Administered 2021-06-06: 75 ug/kg/min via INTRAVENOUS

## 2021-06-06 MED ORDER — METHOCARBAMOL 500 MG PO TABS: 500.0000 mg | ORAL_TABLET | Freq: Three times a day (TID) | ORAL | 1 refills | Status: DC | PRN

## 2021-06-06 MED ORDER — DOCUSATE SODIUM 100 MG PO CAPS
100.0000 mg | ORAL_CAPSULE | Freq: Two times a day (BID) | ORAL | Status: DC
  Administered 2021-06-06 – 2021-06-07 (×2): 100 mg via ORAL
  Filled 2021-06-06 (×2): qty 1

## 2021-06-06 MED ORDER — ONDANSETRON HCL 4 MG/2ML IJ SOLN
INTRAMUSCULAR | Status: DC | PRN
  Administered 2021-06-06: 4 mg via INTRAVENOUS

## 2021-06-06 MED ORDER — ASPIRIN 81 MG PO CHEW
81.0000 mg | CHEWABLE_TABLET | Freq: Two times a day (BID) | ORAL | Status: DC
  Administered 2021-06-07: 81 mg via ORAL
  Filled 2021-06-06: qty 1

## 2021-06-06 MED ORDER — HYDROMORPHONE HCL 1 MG/ML IJ SOLN
0.5000 mg | INTRAMUSCULAR | Status: DC | PRN
  Administered 2021-06-06: 1 mg via INTRAVENOUS
  Filled 2021-06-06: qty 1

## 2021-06-06 MED ORDER — ONDANSETRON HCL 4 MG/2ML IJ SOLN
INTRAMUSCULAR | Status: AC
  Filled 2021-06-06: qty 2

## 2021-06-06 MED ORDER — ACETAMINOPHEN 500 MG PO TABS
1000.0000 mg | ORAL_TABLET | Freq: Once | ORAL | Status: AC
  Administered 2021-06-06: 1000 mg via ORAL
  Filled 2021-06-06: qty 2

## 2021-06-06 MED ORDER — FENTANYL CITRATE (PF) 100 MCG/2ML IJ SOLN: 25.0000 ug | INTRAMUSCULAR | Status: DC | PRN

## 2021-06-06 MED ORDER — ONDANSETRON HCL 4 MG PO TABS: 4.0000 mg | ORAL_TABLET | Freq: Four times a day (QID) | ORAL | Status: DC | PRN

## 2021-06-06 MED ORDER — 0.9 % SODIUM CHLORIDE (POUR BTL) OPTIME
TOPICAL | Status: DC | PRN
  Administered 2021-06-06: 1000 mL

## 2021-06-06 MED ORDER — METHOCARBAMOL 500 MG PO TABS
500.0000 mg | ORAL_TABLET | Freq: Four times a day (QID) | ORAL | Status: DC | PRN
  Administered 2021-06-06: 500 mg via ORAL
  Filled 2021-06-06: qty 1

## 2021-06-06 MED ORDER — CLONIDINE HCL (ANALGESIA) 100 MCG/ML EP SOLN
EPIDURAL | Status: DC | PRN
  Administered 2021-06-06: 50 ug

## 2021-06-06 MED ORDER — BUPIVACAINE LIPOSOME 1.3 % IJ SUSP
INTRAMUSCULAR | Status: DC | PRN
  Administered 2021-06-06: 20 mL

## 2021-06-06 MED ORDER — CHLORHEXIDINE GLUCONATE 0.12 % MT SOLN
15.0000 mL | Freq: Once | OROMUCOSAL | Status: AC
  Administered 2021-06-06: 15 mL via OROMUCOSAL

## 2021-06-06 MED ORDER — OXYCODONE-ACETAMINOPHEN 5-325 MG PO TABS: 1.0000 | ORAL_TABLET | ORAL | 0 refills | Status: AC | PRN

## 2021-06-06 MED ORDER — FERROUS SULFATE 325 (65 FE) MG PO TABS
325.0000 mg | ORAL_TABLET | Freq: Three times a day (TID) | ORAL | Status: DC
  Administered 2021-06-07 (×2): 325 mg via ORAL
  Filled 2021-06-06 (×2): qty 1

## 2021-06-06 MED ORDER — METOCLOPRAMIDE HCL 5 MG/ML IJ SOLN: 5.0000 mg | Freq: Three times a day (TID) | INTRAMUSCULAR | Status: DC | PRN

## 2021-06-06 MED ORDER — LACTATED RINGERS IV SOLN: INTRAVENOUS | Status: DC

## 2021-06-06 MED ORDER — BISACODYL 10 MG RE SUPP: 10.0000 mg | Freq: Every day | RECTAL | Status: DC | PRN

## 2021-06-06 MED ORDER — TRANEXAMIC ACID-NACL 1000-0.7 MG/100ML-% IV SOLN
1000.0000 mg | Freq: Once | INTRAVENOUS | Status: AC
  Administered 2021-06-06: 1000 mg via INTRAVENOUS
  Filled 2021-06-06: qty 100

## 2021-06-06 MED ORDER — BUPIVACAINE-EPINEPHRINE 0.25% -1:200000 IJ SOLN
INTRAMUSCULAR | Status: DC | PRN
  Administered 2021-06-06: 30 mL

## 2021-06-06 SURGICAL SUPPLY — 55 items
ATTUNE PS FEM RT SZ 5 CEM KNEE (Femur) ×3 IMPLANT
ATTUNE PSRP INSR SZ5 8 KNEE (Insert) ×2 IMPLANT
ATTUNE PSRP INSR SZ5 8MM KNEE (Insert) ×1 IMPLANT
BAG COUNTER SPONGE SURGICOUNT (BAG) IMPLANT
BAG SURGICOUNT SPONGE COUNTING (BAG)
BAG ZIPLOCK 12X15 (MISCELLANEOUS) IMPLANT
BASE TIBIA ATTUNE KNEE SYS SZ6 (Knees) ×1 IMPLANT
BLADE SAG 18X100X1.27 (BLADE) ×3 IMPLANT
BLADE SAW SGTL 13X75X1.27 (BLADE) ×3 IMPLANT
BNDG ELASTIC 6X10 VLCR STRL LF (GAUZE/BANDAGES/DRESSINGS) ×3 IMPLANT
BNDG GAUZE ELAST 4 BULKY (GAUZE/BANDAGES/DRESSINGS) ×3 IMPLANT
BOWL SMART MIX CTS (DISPOSABLE) ×3 IMPLANT
CEMENT HV SMART SET (Cement) ×6 IMPLANT
CLOSURE STERI-STRIP 1/2X4 (GAUZE/BANDAGES/DRESSINGS) ×1
CLOSURE WOUND 1/2 X4 (GAUZE/BANDAGES/DRESSINGS) ×2
CLSR STERI-STRIP ANTIMIC 1/2X4 (GAUZE/BANDAGES/DRESSINGS) ×2 IMPLANT
COVER SURGICAL LIGHT HANDLE (MISCELLANEOUS) ×3 IMPLANT
CUFF TOURN SGL QUICK 34 (TOURNIQUET CUFF) ×3
CUFF TRNQT CYL 34X4.125X (TOURNIQUET CUFF) ×1 IMPLANT
DRAPE SHEET LG 3/4 BI-LAMINATE (DRAPES) ×3 IMPLANT
DRAPE U-SHAPE 47X51 STRL (DRAPES) ×3 IMPLANT
DRSG ADAPTIC 3X8 NADH LF (GAUZE/BANDAGES/DRESSINGS) ×3 IMPLANT
DRSG PAD ABDOMINAL 8X10 ST (GAUZE/BANDAGES/DRESSINGS) ×3 IMPLANT
DURAPREP 26ML APPLICATOR (WOUND CARE) ×3 IMPLANT
ELECT REM PT RETURN 15FT ADLT (MISCELLANEOUS) ×3 IMPLANT
GAUZE SPONGE 4X4 12PLY STRL (GAUZE/BANDAGES/DRESSINGS) ×3 IMPLANT
GLOVE SURG ORTHO LTX SZ7.5 (GLOVE) ×3 IMPLANT
GLOVE SURG ORTHO LTX SZ8.5 (GLOVE) ×3 IMPLANT
GLOVE SURG UNDER POLY LF SZ7.5 (GLOVE) ×3 IMPLANT
GLOVE SURG UNDER POLY LF SZ8.5 (GLOVE) ×3 IMPLANT
GOWN STRL REUS W/TWL XL LVL3 (GOWN DISPOSABLE) ×6 IMPLANT
HANDPIECE INTERPULSE COAX TIP (DISPOSABLE) ×3
HOLDER FOLEY CATH W/STRAP (MISCELLANEOUS) IMPLANT
IMMOBILIZER KNEE 20 (SOFTGOODS) ×3
IMMOBILIZER KNEE 20 THIGH 36 (SOFTGOODS) ×1 IMPLANT
KIT TURNOVER KIT A (KITS) ×3 IMPLANT
MANIFOLD NEPTUNE II (INSTRUMENTS) ×3 IMPLANT
NS IRRIG 1000ML POUR BTL (IV SOLUTION) ×3 IMPLANT
PACK TOTAL KNEE CUSTOM (KITS) ×3 IMPLANT
PATELLA MEDIAL ATTUN 35MM KNEE (Knees) ×3 IMPLANT
PENCIL SMOKE EVACUATOR (MISCELLANEOUS) IMPLANT
PIN DRILL FIX HALF THREAD (BIT) ×3 IMPLANT
PIN STEINMAN FIXATION KNEE (PIN) ×3 IMPLANT
PROTECTOR NERVE ULNAR (MISCELLANEOUS) ×3 IMPLANT
SET HNDPC FAN SPRY TIP SCT (DISPOSABLE) ×1 IMPLANT
STAPLER VISISTAT 35W (STAPLE) IMPLANT
STRIP CLOSURE SKIN 1/2X4 (GAUZE/BANDAGES/DRESSINGS) ×4 IMPLANT
SUT MNCRL AB 3-0 PS2 18 (SUTURE) ×3 IMPLANT
SUT VIC AB 0 CT1 36 (SUTURE) ×3 IMPLANT
SUT VIC AB 1 CT1 36 (SUTURE) ×9 IMPLANT
SUT VIC AB 2-0 CT1 27 (SUTURE) ×3
SUT VIC AB 2-0 CT1 TAPERPNT 27 (SUTURE) ×1 IMPLANT
TIBIA ATTUNE KNEE SYS BASE SZ6 (Knees) ×3 IMPLANT
TRAY FOLEY MTR SLVR 16FR STAT (SET/KITS/TRAYS/PACK) ×3 IMPLANT
WATER STERILE IRR 1000ML POUR (IV SOLUTION) ×6 IMPLANT

## 2021-06-06 NOTE — Anesthesia Preprocedure Evaluation (Addendum)
Anesthesia Evaluation  Patient identified by MRN, date of birth, ID band Patient awake    Reviewed: Allergy & Precautions, NPO status , Patient's Chart, lab work & pertinent test results  Airway Mallampati: II  TM Distance: >3 FB Neck ROM: Full    Dental  (+) Dental Advisory Given, Lower Dentures, Upper Dentures   Pulmonary neg pulmonary ROS,    Pulmonary exam normal breath sounds clear to auscultation       Cardiovascular negative cardio ROS Normal cardiovascular exam Rhythm:Regular Rate:Normal     Neuro/Psych  Neuromuscular disease    GI/Hepatic Neg liver ROS, GERD  ,  Endo/Other  negative endocrine ROS  Renal/GU negative Renal ROS     Musculoskeletal  (+) Arthritis , right knee osteoarthritis end stage   Abdominal   Peds  Hematology  (+) Blood dyscrasia, anemia , Plt 154k   Anesthesia Other Findings Day of surgery medications reviewed with the patient.  Reproductive/Obstetrics negative OB ROS                            Anesthesia Physical Anesthesia Plan  ASA: 2  Anesthesia Plan: Spinal   Post-op Pain Management:  Regional for Post-op pain   Induction: Intravenous  PONV Risk Score and Plan: 1 and Dexamethasone, Ondansetron and Propofol infusion  Airway Management Planned: Natural Airway and Nasal Cannula  Additional Equipment:   Intra-op Plan:   Post-operative Plan:   Informed Consent: I have reviewed the patients History and Physical, chart, labs and discussed the procedure including the risks, benefits and alternatives for the proposed anesthesia with the patient or authorized representative who has indicated his/her understanding and acceptance.     Dental advisory given  Plan Discussed with: CRNA  Anesthesia Plan Comments:        Anesthesia Quick Evaluation

## 2021-06-06 NOTE — Anesthesia Postprocedure Evaluation (Signed)
Anesthesia Post Note  Patient: Robley Dutton  Procedure(s) Performed: TOTAL KNEE ARTHROPLASTY (Right: Knee)     Patient location during evaluation: PACU Anesthesia Type: General Level of consciousness: oriented, awake and alert and awake Pain management: pain level controlled Vital Signs Assessment: post-procedure vital signs reviewed and stable Respiratory status: spontaneous breathing, respiratory function stable and nonlabored ventilation Cardiovascular status: blood pressure returned to baseline and stable Postop Assessment: no headache, no backache, no apparent nausea or vomiting, patient able to bend at knees and spinal receding Anesthetic complications: no   No notable events documented.  Last Vitals:  Vitals:   06/06/21 1800 06/06/21 1815  BP: 116/67 108/65  Pulse: 60 63  Resp: 20 15  Temp: (!) 35.6 C (!) 36.3 C  SpO2: 100% 100%    Last Pain:  Vitals:   06/06/21 1815  TempSrc:   PainSc: 0-No pain                 Catalina Gravel

## 2021-06-06 NOTE — Brief Op Note (Signed)
06/06/2021  4:54 PM  PATIENT:  Michael Burch  80 y.o. male  PRE-OPERATIVE DIAGNOSIS:  right knee osteoarthritis end stage  POST-OPERATIVE DIAGNOSIS:  right knee osteoarthritis end stage  PROCEDURE:  Procedure(s): TOTAL KNEE ARTHROPLASTY (Right) DePuy Attune  SURGEON:  Surgeon(s) and Role:    [name], MD - Primary  PHYSICIAN ASSISTANT:   ASSISTANTS: Ventura Bruns, PA-C   ANESTHESIA:   regional and spinal  EBL:  minimal  BLOOD ADMINISTERED:none  DRAINS: none   LOCAL MEDICATIONS USED:  MARCAINE     SPECIMEN:  No Specimen  DISPOSITION OF SPECIMEN:  N/A  COUNTS:  YES  TOURNIQUET:  * Missing tourniquet times found for documented tourniquets in log: [id number] *  DICTATION: .Other Dictation: Dictation Number [id number]  PLAN OF CARE: Admit for overnight observation  PATIENT DISPOSITION:  PACU - hemodynamically stable.   Delay start of Pharmacological VTE agent (>24hrs) due to surgical blood loss or risk of bleeding: no

## 2021-06-06 NOTE — Anesthesia Procedure Notes (Signed)
Anesthesia Regional Block: Adductor canal block   Pre-Anesthetic Checklist: , timeout performed,  Correct Patient, Correct Site, Correct Laterality,  Correct Procedure, Correct Position, site marked,  Risks and benefits discussed,  Surgical consent,  Pre-op evaluation,  At surgeon's request and post-op pain management  Laterality: Right  Prep: chloraprep       Needles:  Injection technique: Single-shot  Needle Type: Echogenic Needle     Needle Length: 9cm  Needle Gauge: 21     Additional Needles:   Procedures:,,,, ultrasound used (permanent image in chart),,    Narrative:  Start time: 06/06/2021 2:10 PM End time: 06/06/2021 2:16 PM Injection made incrementally with aspirations every 5 mL.  Performed by: Personally  Anesthesiologist: Catalina Gravel, MD  Additional Notes: No pain on injection. No increased resistance to injection. Injection made in 5cc increments.  Good needle visualization.  Patient tolerated procedure well.

## 2021-06-06 NOTE — Anesthesia Procedure Notes (Signed)
Spinal  Patient location during procedure: OR End time: 06/06/2021 3:30 PM Reason for block: surgical anesthesia Staffing Performed: other anesthesia staff  Anesthesiologist: Nolon Nations, MD Resident/CRNA: Noralyn Pick D, CRNA Preanesthetic Checklist Completed: patient identified, IV checked, site marked, risks and benefits discussed, surgical consent, monitors and equipment checked, pre-op evaluation and timeout performed Spinal Block Patient position: sitting Prep: DuraPrep Patient monitoring: heart rate, continuous pulse ox and blood pressure Injection technique: single-shot Needle Needle type: Pencan  Needle gauge: 24 G Needle length: 9 cm Assessment Sensory level: T6 Events: CSF return Additional Notes

## 2021-06-06 NOTE — Progress Notes (Signed)
Orthopedic Tech Progress Note Patient Details:  Michael Burch 08-24-1941 [id number]  CPM Right Knee CPM Right Knee: On Right Knee Flexion (Degrees): 60 Right Knee Extension (Degrees): 0  Post Interventions Patient Tolerated: Well Instructions Provided: Care of device  [name] 06/06/2021, 5:48 PM

## 2021-06-06 NOTE — Transfer of Care (Signed)
Immediate Anesthesia Transfer of Care Note  Patient: Michael Burch  Procedure(s) Performed: TOTAL KNEE ARTHROPLASTY (Right: Knee)  Patient Location: PACU  Anesthesia Type:Spinal  Level of Consciousness: drowsy and patient cooperative  Airway & Oxygen Therapy: Patient Spontanous Breathing and Patient connected to face mask oxygen  Post-op Assessment: Report given to RN and Post -op Vital signs reviewed and stable  Post vital signs: Reviewed and stable  Last Vitals:  Vitals Value Taken Time  BP 88/53 06/06/21 1710  Temp    Pulse 59 06/06/21 1711  Resp 15 06/06/21 1711  SpO2 100 % 06/06/21 1711  Vitals shown include unvalidated device data.  Last Pain:  Vitals:   06/06/21 1440  TempSrc:   PainSc: Asleep         Complications: No notable events documented.

## 2021-06-06 NOTE — Progress Notes (Addendum)
AssistedDr. Gifford Shave with right, ultrasound guided adductor canal block.   Side rails up, monitors on throughout procedure. See vital signs in flow sheet. Tolerated Procedure well.

## 2021-06-06 NOTE — Interval H&P Note (Signed)
History and Physical Interval Note:  06/06/2021 2:40 PM  Michael Burch  has presented today for surgery, with the diagnosis of right knee osteoarthritis end stage.  The various methods of treatment have been discussed with the patient and family. After consideration of risks, benefits and other options for treatment, the patient has consented to  Procedure(s): TOTAL KNEE ARTHROPLASTY (Right) as a surgical intervention.  The patient's history has been reviewed, patient examined, no change in status, stable for surgery.  I have reviewed the patient's chart and labs.  Questions were answered to the patient's satisfaction.     Augustin Schooling

## 2021-06-06 NOTE — Discharge Instructions (Signed)
Ice to the knee constantly.  Keep the incision covered and clean and dry for one week, then ok to get it wet in the shower.  Do exercise as instructed every hour, please to prevent stiffness.    DO NOT prop anything under the knee, it will make your knee stiff.  Prop under the ankle to encourage your knee to go straight.   Use the walker while you are up and around for balance.  Wear your support stockings 24/7 to prevent blood clots and take baby aspirin twice daily for 30 days also to prevent blood clots  Follow up with Dr Arlana Canizales in two weeks in the office, call [phone] for appt   INSTRUCTIONS AFTER JOINT REPLACEMENT   Remove items at home which could result in a fall. This includes throw rugs or furniture in walking pathways ICE to the affected joint every three hours while awake for 30 minutes at a time, for at least the first 3-5 days, and then as needed for pain and swelling.  Continue to use ice for pain and swelling. You may notice swelling that will progress down to the foot and ankle.  This is normal after surgery.  Elevate your leg when you are not up walking on it.   Continue to use the breathing machine you got in the hospital (incentive spirometer) which will help keep your temperature down.  It is common for your temperature to cycle up and down following surgery, especially at night when you are not up moving around and exerting yourself.  The breathing machine keeps your lungs expanded and your temperature down.   DIET:  As you were doing prior to hospitalization, we recommend a well-balanced diet.  DRESSING / WOUND CARE / SHOWERING  You may change your dressing 3-5 days after surgery.  Then change the dressing every day with sterile gauze.  Please use good hand washing techniques before changing the dressing.  Do not use any lotions or creams on the incision until instructed by your surgeon.  ACTIVITY  Increase activity slowly as tolerated, but follow the weight  bearing instructions below.   No driving for 6 weeks or until further direction given by your physician.  You cannot drive while taking narcotics.  No lifting or carrying greater than 10 lbs. until further directed by your surgeon. Avoid periods of inactivity such as sitting longer than an hour when not asleep. This helps prevent blood clots.  You may return to work once you are authorized by your doctor.     WEIGHT BEARING   Weight bearing as tolerated with assist device (walker, cane, etc) as directed, use it as long as suggested by your surgeon or therapist, typically at least 4-6 weeks.   EXERCISES  Results after joint replacement surgery are often greatly improved when you follow the exercise, range of motion and muscle strengthening exercises prescribed by your doctor. Safety measures are also important to protect the joint from further injury. Any time any of these exercises cause you to have increased pain or swelling, decrease what you are doing until you are comfortable again and then slowly increase them. If you have problems or questions, call your caregiver or physical therapist for advice.   Rehabilitation is important following a joint replacement. After just a few days of immobilization, the muscles of the leg can become weakened and shrink (atrophy).  These exercises are designed to build up the tone and strength of the thigh and leg muscles and to   improve motion. Often times heat used for twenty to thirty minutes before working out will loosen up your tissues and help with improving the range of motion but do not use heat for the first two weeks following surgery (sometimes heat can increase post-operative swelling).   These exercises can be done on a training (exercise) mat, on the floor, on a table or on a bed. Use whatever works the best and is most comfortable for you.    Use music or television while you are exercising so that the exercises are a pleasant break in your day.  This will make your life better with the exercises acting as a break in your routine that you can look forward to.   Perform all exercises about fifteen times, three times per day or as directed.  You should exercise both the operative leg and the other leg as well.  Exercises include:   Quad Sets - Tighten up the muscle on the front of the thigh (Quad) and hold for 5-10 seconds.   Straight Leg Raises - With your knee straight (if you were given a brace, keep it on), lift the leg to 60 degrees, hold for 3 seconds, and slowly lower the leg.  Perform this exercise against resistance later as your leg gets stronger.  Leg Slides: Lying on your back, slowly slide your foot toward your buttocks, bending your knee up off the floor (only go as far as is comfortable). Then slowly slide your foot back down until your leg is flat on the floor again.  Angel Wings: Lying on your back spread your legs to the side as far apart as you can without causing discomfort.  Hamstring Strength:  Lying on your back, push your heel against the floor with your leg straight by tightening up the muscles of your buttocks.  Repeat, but this time bend your knee to a comfortable angle, and push your heel against the floor.  You may put a pillow under the heel to make it more comfortable if necessary.   A rehabilitation program following joint replacement surgery can speed recovery and prevent re-injury in the future due to weakened muscles. Contact your doctor or a physical therapist for more information on knee rehabilitation.    CONSTIPATION  Constipation is defined medically as fewer than three stools per week and severe constipation as less than one stool per week.  Even if you have a regular bowel pattern at home, your normal regimen is likely to be disrupted due to multiple reasons following surgery.  Combination of anesthesia, postoperative narcotics, change in appetite and fluid intake all can affect your bowels.   YOU MUST  use at least one of the following options; they are listed in order of increasing strength to get the job done.  They are all available over the counter, and you may need to use some, POSSIBLY even all of these options:    Drink plenty of fluids (prune juice may be helpful) and high fiber foods Colace 100 mg by mouth twice a day  Senokot for constipation as directed and as needed Dulcolax (bisacodyl), take with full glass of water  Miralax (polyethylene glycol) once or twice a day as needed.  If you have tried all these things and are unable to have a bowel movement in the first 3-4 days after surgery call either your surgeon or your primary doctor.    If you experience loose stools or diarrhea, hold the medications until you stool forms back   up.  If your symptoms do not get better within 1 week or if they get worse, check with your doctor.  If you experience "the worst abdominal pain ever" or develop nausea or vomiting, please contact the office immediately for further recommendations for treatment.   ITCHING:  If you experience itching with your medications, try taking only a single pain pill, or even half a pain pill at a time.  You can also use Benadryl over the counter for itching or also to help with sleep.   TED HOSE STOCKINGS:  Use stockings on both legs until for at least 2 weeks or as directed by physician office. They may be removed at night for sleeping.  MEDICATIONS:  See your medication summary on the "After Visit Summary" that nursing will review with you.  You may have some home medications which will be placed on hold until you complete the course of blood thinner medication.  It is important for you to complete the blood thinner medication as prescribed.  PRECAUTIONS:  If you experience chest pain or shortness of breath - call 911 immediately for transfer to the hospital emergency department.   If you develop a fever greater that 101 F, purulent drainage from wound, increased  redness or drainage from wound, foul odor from the wound/dressing, or calf pain - CONTACT YOUR SURGEON.                                                   FOLLOW-UP APPOINTMENTS:  If you do not already have a post-op appointment, please call the office for an appointment to be seen by your surgeon.  Guidelines for how soon to be seen are listed in your "After Visit Summary", but are typically between 1-4 weeks after surgery.  OTHER INSTRUCTIONS:   Knee Replacement:  Do not place pillow under knee, focus on keeping the knee straight while resting. CPM instructions: 0-90 degrees, 2 hours in the morning, 2 hours in the afternoon, and 2 hours in the evening. Place foam block, curve side up under heel at all times except when in CPM or when walking.  DO NOT modify, tear, cut, or change the foam block in any way.  POST-OPERATIVE OPIOID TAPER INSTRUCTIONS: It is important to wean off of your opioid medication as soon as possible. If you do not need pain medication after your surgery it is ok to stop day one. Opioids include: Codeine, Hydrocodone(Norco, Vicodin), Oxycodone(Percocet, oxycontin) and hydromorphone amongst others.  Long term and even short term use of opiods can cause: Increased pain response Dependence Constipation Depression Respiratory depression And more.  Withdrawal symptoms can include Flu like symptoms Nausea, vomiting And more Techniques to manage these symptoms Hydrate well Eat regular healthy meals Stay active Use relaxation techniques(deep breathing, meditating, yoga) Do Not substitute Alcohol to help with tapering If you have been on opioids for less than two weeks and do not have pain than it is ok to stop all together.  Plan to wean off of opioids This plan should start within one week post op of your joint replacement. Maintain the same interval or time between taking each dose and first decrease the dose.  Cut the total daily intake of opioids by one tablet each  day Next start to increase the time between doses. The last dose that should be eliminated is   the evening dose.   MAKE SURE YOU:  Understand these instructions.  Get help right away if you are not doing well or get worse.    Thank you for letting us be a part of your medical care team.  It is a privilege we respect greatly.  We hope these instructions will help you stay on track for a fast and full recovery!      

## 2021-06-06 NOTE — Care Plan (Signed)
Ortho Bundle Case Management Note  Patient Details  Name: Michael Burch MRN: [id number] Date of Birth: [birth year]                  L TKA on 06-06-21 DCP: Home with wife and dtr. 1 story home with 2 ste DME: Has a RW. 3-in-1 ordered through [place] PT: EO. PT eval scheduled on 06-10-21.   DME Arranged:  3-N-1 DME Agency:  [facility]  HH Arranged:    [place] Agency:     Additional Comments: Please contact me with any questions of if this plan should need to change.  Marianne Sofia, RN,CCM [facility]  [phone] 06/06/2021, 2:02 PM

## 2021-06-07 DIAGNOSIS — Z96652 Presence of left artificial knee joint: Secondary | ICD-10-CM | POA: Diagnosis not present

## 2021-06-07 DIAGNOSIS — Z8616 Personal history of COVID-19: Secondary | ICD-10-CM | POA: Diagnosis not present

## 2021-06-07 DIAGNOSIS — M1711 Unilateral primary osteoarthritis, right knee: Secondary | ICD-10-CM | POA: Diagnosis not present

## 2021-06-07 LAB — BASIC METABOLIC PANEL
Anion gap: 8 (ref 5–15)
BUN: 15 mg/dL (ref 8–23)
CO2: 24 mmol/L (ref 22–32)
Calcium: 9.1 mg/dL (ref 8.9–10.3)
Chloride: 104 mmol/L (ref 98–111)
Creatinine, Ser: 1 mg/dL (ref 0.61–1.24)
GFR, Estimated: >60 mL/min (ref >60)
Glucose, Bld: 173 mg/dL — ABNORMAL HIGH (ref 70–99)
Potassium: 3.8 mmol/L (ref 3.5–5.1)
Sodium: 136 mmol/L (ref 135–145)

## 2021-06-07 LAB — CBC
HCT: 39.4 % (ref 39.0–52.0)
Hemoglobin: 13.2 g/dL (ref 13.0–17.0)
MCH: 31.4 pg (ref 26.0–34.0)
MCHC: 33.5 g/dL (ref 30.0–36.0)
MCV: 93.6 fL (ref 80.0–100.0)
Platelets: 150 10*3/uL (ref 150–400)
RBC: 4.21 MIL/uL — ABNORMAL LOW (ref 4.22–5.81)
RDW: 12.8 % (ref 11.5–15.5)
WBC: 10.6 10*3/uL — ABNORMAL HIGH (ref 4.0–10.5)
nRBC: 0 % (ref 0.0–0.2)

## 2021-06-07 NOTE — TOC Transition Note (Signed)
Transition of Care [facility]) - CM/SW Discharge Note   Patient Details  Name: Michael Burch MRN: [id number] Date of Birth: 1941/01/31  [facility] CM/SW Contact:  Lennart Pall, LCSW Phone Number: 06/07/2021, 10:18 AM   Clinical Narrative:     Met with pt and daughter who confirm plan for OPPT at [facility].  Pt has not yet received his 3n1 and [facility] says unable to deliver today so order transferred to [place].  Will be delivered to pt's room prior to dc.  No further TOC needs.  Final next level of care: OP Rehab Barriers to Discharge: No Barriers Identified   Patient Goals and CMS Choice Patient states their goals for this hospitalization and ongoing recovery are:: return home      Discharge Placement                       Discharge Plan and Services                DME Arranged: 3-N-1 DME Agency: [facility] Date DME Agency Contacted: 06/07/21 Time DME Agency Contacted: 1000 Representative spoke with at DME Agency: [place] ([place]) Interventions     Readmission Risk Interventions No flowsheet data found.

## 2021-06-07 NOTE — Plan of Care (Signed)
°  Problem: Education: °Goal: Knowledge of the prescribed therapeutic regimen will improve °Outcome: Progressing °  °Problem: Clinical Measurements: °Goal: Postoperative complications will be avoided or minimized °Outcome: Progressing °  °Problem: Pain Management: °Goal: Pain level will decrease with appropriate interventions °Outcome: Progressing °  °
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
## 2021-06-07 NOTE — Plan of Care (Signed)
D/c'd 

## 2021-06-07 NOTE — Progress Notes (Signed)
    Subjective:  Patient reports pain as mild to moderate.  Denies N/V/CP/SOB.   Objective:   VITALS:   Vitals:   06/06/21 1923 06/06/21 2200 06/07/21 0039 06/07/21 0356  BP: 119/73 (!) 155/91 122/76 133/71  Pulse: 64 (!) 53 70 60  Resp: '16 16 14 14  '$ Temp: 97.6 F (36.4 C) (!) 97.5 F (36.4 C) (!) 97.5 F (36.4 C) 97.6 F (36.4 C)  TempSrc: Oral Oral Oral Oral  SpO2: 100% 100% 100% 100%  Weight:    60 kg  Height:    '5\' 2"'$  (1.575 m)    NAD ABD soft Neurovascular intact Sensation intact distally Intact pulses distally Dorsiflexion/Plantar flexion intact Incision: dressing C/D/I   Lab Results  Component Value Date   WBC 10.6 (H) 06/07/2021   HGB 13.2 06/07/2021   HCT 39.4 06/07/2021   MCV 93.6 06/07/2021   PLT 150 06/07/2021   BMET    Component Value Date/Time   NA 136 06/07/2021 0322   NA 141 04/01/2020 1106   K 3.8 06/07/2021 0322   CL 104 06/07/2021 0322   CO2 24 06/07/2021 0322   GLUCOSE 173 (H) 06/07/2021 0322   BUN 15 06/07/2021 0322   BUN 17 04/01/2020 1106   CREATININE 1.00 06/07/2021 0322   CALCIUM 9.1 06/07/2021 0322   GFRNONAA >60 06/07/2021 0322   GFRAA 68 04/01/2020 1106     Assessment/Plan: 1 Day Post-Op   Active Problems:   Status post total knee replacement, right   WBAT with walker DVT ppx: Aspirin, SCDs, TEDS PO pain control PT/OT Dispo: D/C planned for Sunday/Monday depending on pain control     Michael Burch 06/07/2021, 10:23 AM [facility] is now [address], [place], [place] [address] Phone: [phone] [email or web address]  [employer]
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
## 2021-06-09 ENCOUNTER — Encounter (HOSPITAL_COMMUNITY): Payer: Self-pay | Admitting: Orthopedic Surgery
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
## 2021-06-10 ENCOUNTER — Emergency Department (HOSPITAL_COMMUNITY)
Admission: EM | Admit: 2021-06-10 | Discharge: 2021-06-11 | Disposition: A | Discharge status: Home / Self Care | Payer: Medicare Other | Attending: Emergency Medicine | Admitting: Emergency Medicine

## 2021-06-10 ENCOUNTER — Emergency Department (HOSPITAL_COMMUNITY): Payer: Medicare Other

## 2021-06-10 DIAGNOSIS — R110 Nausea: Secondary | ICD-10-CM

## 2021-06-10 DIAGNOSIS — Z7982 Long term (current) use of aspirin: Secondary | ICD-10-CM | POA: Insufficient documentation

## 2021-06-10 DIAGNOSIS — R339 Retention of urine, unspecified: Secondary | ICD-10-CM | POA: Diagnosis not present

## 2021-06-10 DIAGNOSIS — K219 Gastro-esophageal reflux disease without esophagitis: Secondary | ICD-10-CM | POA: Diagnosis not present

## 2021-06-10 DIAGNOSIS — N3289 Other specified disorders of bladder: Secondary | ICD-10-CM | POA: Diagnosis not present

## 2021-06-10 DIAGNOSIS — S32000A Wedge compression fracture of unspecified lumbar vertebra, initial encounter for closed fracture: Secondary | ICD-10-CM | POA: Diagnosis not present

## 2021-06-10 DIAGNOSIS — M40209 Unspecified kyphosis, site unspecified: Secondary | ICD-10-CM | POA: Diagnosis not present

## 2021-06-10 DIAGNOSIS — Z20822 Contact with and (suspected) exposure to covid-19: Secondary | ICD-10-CM | POA: Insufficient documentation

## 2021-06-10 DIAGNOSIS — R066 Hiccough: Secondary | ICD-10-CM | POA: Diagnosis not present

## 2021-06-10 DIAGNOSIS — J9811 Atelectasis: Secondary | ICD-10-CM | POA: Diagnosis not present

## 2021-06-10 DIAGNOSIS — Z743 Need for continuous supervision: Secondary | ICD-10-CM | POA: Diagnosis not present

## 2021-06-10 DIAGNOSIS — Z96653 Presence of artificial knee joint, bilateral: Secondary | ICD-10-CM | POA: Insufficient documentation

## 2021-06-10 DIAGNOSIS — I700 Atherosclerosis of aorta: Secondary | ICD-10-CM | POA: Diagnosis not present

## 2021-06-10 DIAGNOSIS — R079 Chest pain, unspecified: Secondary | ICD-10-CM | POA: Diagnosis not present

## 2021-06-10 DIAGNOSIS — R509 Fever, unspecified: Secondary | ICD-10-CM | POA: Diagnosis not present

## 2021-06-10 DIAGNOSIS — N323 Diverticulum of bladder: Secondary | ICD-10-CM | POA: Diagnosis not present

## 2021-06-10 DIAGNOSIS — Z96611 Presence of right artificial shoulder joint: Secondary | ICD-10-CM | POA: Diagnosis not present

## 2021-06-10 DIAGNOSIS — R112 Nausea with vomiting, unspecified: Secondary | ICD-10-CM | POA: Diagnosis not present

## 2021-06-10 DIAGNOSIS — Z8616 Personal history of COVID-19: Secondary | ICD-10-CM | POA: Insufficient documentation

## 2021-06-10 DIAGNOSIS — N1330 Unspecified hydronephrosis: Secondary | ICD-10-CM | POA: Diagnosis not present

## 2021-06-10 DIAGNOSIS — R12 Heartburn: Secondary | ICD-10-CM | POA: Diagnosis not present

## 2021-06-10 DIAGNOSIS — M25561 Pain in right knee: Secondary | ICD-10-CM | POA: Diagnosis not present

## 2021-06-10 DIAGNOSIS — R531 Weakness: Secondary | ICD-10-CM

## 2021-06-10 LAB — CBC WITH DIFFERENTIAL/PLATELET
Abs Immature Granulocytes: 0.03 10*3/uL (ref 0.00–0.07)
Basophils Absolute: 0 10*3/uL (ref 0.0–0.1)
Basophils Relative: 0 %
Eosinophils Absolute: 0.2 10*3/uL (ref 0.0–0.5)
Eosinophils Relative: 3 %
HCT: 30 % — ABNORMAL LOW (ref 39.0–52.0)
Hemoglobin: 10 g/dL — ABNORMAL LOW (ref 13.0–17.0)
Immature Granulocytes: 1 %
Lymphocytes Relative: 18 %
Lymphs Abs: 1.2 10*3/uL (ref 0.7–4.0)
MCH: 31.8 pg (ref 26.0–34.0)
MCHC: 33.3 g/dL (ref 30.0–36.0)
MCV: 95.5 fL (ref 80.0–100.0)
Monocytes Absolute: 0.9 10*3/uL (ref 0.1–1.0)
Monocytes Relative: 14 %
Neutro Abs: 4.1 10*3/uL (ref 1.7–7.7)
Neutrophils Relative %: 64 %
Platelets: 169 10*3/uL (ref 150–400)
RBC: 3.14 MIL/uL — ABNORMAL LOW (ref 4.22–5.81)
RDW: 12.9 % (ref 11.5–15.5)
WBC: 6.4 10*3/uL (ref 4.0–10.5)
nRBC: 0 % (ref 0.0–0.2)

## 2021-06-10 LAB — URINALYSIS, ROUTINE W REFLEX MICROSCOPIC
Bacteria, UA: NONE SEEN
Bilirubin Urine: NEGATIVE
Glucose, UA: NEGATIVE mg/dL
Ketones, ur: NEGATIVE mg/dL
Leukocytes,Ua: NEGATIVE
Nitrite: NEGATIVE
Protein, ur: NEGATIVE mg/dL
Specific Gravity, Urine: 1.032 — ABNORMAL HIGH (ref 1.005–1.030)
pH: 7 (ref 5.0–8.0)

## 2021-06-10 LAB — COMPREHENSIVE METABOLIC PANEL
ALT: 11 U/L (ref 0–44)
AST: 20 U/L (ref 15–41)
Albumin: 2.7 g/dL — ABNORMAL LOW (ref 3.5–5.0)
Alkaline Phosphatase: 48 U/L (ref 38–126)
Anion gap: 8 (ref 5–15)
BUN: 20 mg/dL (ref 8–23)
CO2: 26 mmol/L (ref 22–32)
Calcium: 8.9 mg/dL (ref 8.9–10.3)
Chloride: 101 mmol/L (ref 98–111)
Creatinine, Ser: 1.05 mg/dL (ref 0.61–1.24)
GFR, Estimated: >60 mL/min (ref >60)
Glucose, Bld: 104 mg/dL — ABNORMAL HIGH (ref 70–99)
Potassium: 4.2 mmol/L (ref 3.5–5.1)
Sodium: 135 mmol/L (ref 135–145)
Total Bilirubin: 1.2 mg/dL (ref 0.3–1.2)
Total Protein: 5.6 g/dL — ABNORMAL LOW (ref 6.5–8.1)

## 2021-06-10 LAB — TROPONIN I (HIGH SENSITIVITY): Troponin I (High Sensitivity): 10 ng/L (ref <18)

## 2021-06-10 LAB — RESP PANEL BY RT-PCR (FLU A&B, COVID) ARPGX2
Influenza A by PCR: NEGATIVE
Influenza B by PCR: NEGATIVE
SARS Coronavirus 2 by RT PCR: NEGATIVE

## 2021-06-10 LAB — LACTIC ACID, PLASMA: Lactic Acid, Venous: 1.3 mmol/L (ref 0.5–1.9)

## 2021-06-10 LAB — CBG MONITORING, ED: Glucose-Capillary: 80 mg/dL (ref 70–99)

## 2021-06-10 MED ORDER — PANTOPRAZOLE SODIUM 40 MG IV SOLR
40.0000 mg | Freq: Once | INTRAVENOUS | Status: AC
  Administered 2021-06-10: 40 mg via INTRAVENOUS
  Filled 2021-06-10: qty 40

## 2021-06-10 MED ORDER — SUCRALFATE 1 G PO TABS: 1.0000 g | ORAL_TABLET | Freq: Three times a day (TID) | ORAL | 0 refills | Status: DC

## 2021-06-10 MED ORDER — ALUM & MAG HYDROXIDE-SIMETH 200-200-20 MG/5ML PO SUSP
30.0000 mL | Freq: Once | ORAL | Status: AC
  Administered 2021-06-10: 30 mL via ORAL
  Filled 2021-06-10: qty 30

## 2021-06-10 MED ORDER — PANTOPRAZOLE SODIUM 20 MG PO TBEC: 20.0000 mg | DELAYED_RELEASE_TABLET | Freq: Every day | ORAL | 0 refills | Status: DC

## 2021-06-10 MED ORDER — LIDOCAINE VISCOUS HCL 2 % MT SOLN
15.0000 mL | Freq: Once | OROMUCOSAL | Status: AC
  Administered 2021-06-10: 15 mL via ORAL
  Filled 2021-06-10: qty 15

## 2021-06-10 MED ORDER — IOHEXOL 350 MG/ML SOLN
100.0000 mL | Freq: Once | INTRAVENOUS | Status: AC | PRN
  Administered 2021-06-10: 100 mL via INTRAVENOUS

## 2021-06-10 MED ORDER — SODIUM CHLORIDE 0.9 % IV BOLUS
1000.0000 mL | Freq: Once | INTRAVENOUS | Status: AC
  Administered 2021-06-10: 1000 mL via INTRAVENOUS

## 2021-06-10 MED ORDER — CHLORPROMAZINE HCL 25 MG PO TABS
25.0000 mg | ORAL_TABLET | Freq: Once | ORAL | Status: AC
  Administered 2021-06-10: 25 mg via ORAL
  Filled 2021-06-10: qty 1
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
## 2021-06-10 NOTE — ED Triage Notes (Signed)
Per daughter - pt c/o heartburn x 5 days - pain starts after eating - tried pepcid and diazepam but no relief.

## 2021-06-10 NOTE — Discharge Instructions (Addendum)
For your suspected acid reflux take Protonix and Carafate as prescribed.  Follow-up with gastroenterology, numbers provided.  If you develop any black or tarry looking stools please return for evaluation.  For your urinary retention, follow-up with urology.  There is no evidence of urine infection.  If you have any persistence of fever and/or worsening right knee pain or purulent drainage from your right knee follow-up more closely with orthopedics.

## 2021-06-10 NOTE — ED Provider Notes (Signed)
Emergency Medicine Provider Triage Evaluation Note  Wilner Laurich , a 80 y.o. male  was evaluated in triage.  Pt complains of hiccups since surgery for knee replacemetn   Review of Systems  Positive: Epigastric pain Negative: Fever   Physical Exam  BP 140/79 (BP Location: Right Arm)   Pulse 77   Temp 99.9 F (37.7 C) (Oral)   Resp 18   Ht '5\' 2"'$  (1.575 m)   Wt 60 kg   SpO2 98%   BMI 24.19 kg/m  Gen:   Awake, hiccups  Resp:  Normal effort  MSK:   Right leg in brace Othe  Medical Decision Making  Medically screening exam initiated at 8:14 PM.  Appropriate orders placed.  Ryananthony Durbano was informed that the remainder of the evaluation will be completed by another provider, this initial triage assessment does not replace that evaluation, and the importance of remaining in the ED until their evaluation is complete.     Fransico Meadow, PA-C 06/10/21 2016    Jeanell Sparrow, DO 06/11/21 [phone]

## 2021-06-10 NOTE — ED Triage Notes (Signed)
Brought in by [facility] from home - c/o generalized weakness, nausea and vomiting -    Knee replacement Right on 06/06/21 - pt has been feeling weak. Yesterday developed fever, nausea and vomiting.   98.7temp. bp 156/81 hr 74 rr18 G20 left ac. '4mg'$  zofran IV. 500cc saline

## 2021-06-12 LAB — URINE CULTURE: Culture: NO GROWTH

## 2021-06-13 DIAGNOSIS — M25561 Pain in right knee: Secondary | ICD-10-CM | POA: Diagnosis not present
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
## 2021-06-16 DIAGNOSIS — M25561 Pain in right knee: Secondary | ICD-10-CM | POA: Diagnosis not present

## 2021-06-18 DIAGNOSIS — M25561 Pain in right knee: Secondary | ICD-10-CM | POA: Diagnosis not present

## 2021-06-20 DIAGNOSIS — M25561 Pain in right knee: Secondary | ICD-10-CM | POA: Diagnosis not present

## 2021-06-23 DIAGNOSIS — M25561 Pain in right knee: Secondary | ICD-10-CM | POA: Diagnosis not present

## 2021-06-24 DIAGNOSIS — Z4789 Encounter for other orthopedic aftercare: Secondary | ICD-10-CM | POA: Diagnosis not present

## 2021-06-25 DIAGNOSIS — R338 Other retention of urine: Secondary | ICD-10-CM | POA: Diagnosis not present

## 2021-06-25 DIAGNOSIS — M25561 Pain in right knee: Secondary | ICD-10-CM | POA: Diagnosis not present

## 2021-06-26 DIAGNOSIS — R338 Other retention of urine: Secondary | ICD-10-CM | POA: Diagnosis not present

## 2021-06-27 DIAGNOSIS — M25561 Pain in right knee: Secondary | ICD-10-CM | POA: Diagnosis not present

## 2021-07-01 DIAGNOSIS — M25561 Pain in right knee: Secondary | ICD-10-CM | POA: Diagnosis not present

## 2021-07-04 DIAGNOSIS — M25561 Pain in right knee: Secondary | ICD-10-CM | POA: Diagnosis not present

## 2021-07-07 DIAGNOSIS — M25561 Pain in right knee: Secondary | ICD-10-CM | POA: Diagnosis not present

## 2021-07-11 ENCOUNTER — Other Ambulatory Visit: Payer: Self-pay

## 2021-07-11 ENCOUNTER — Encounter (HOSPITAL_COMMUNITY): Payer: Self-pay

## 2021-07-11 ENCOUNTER — Emergency Department (HOSPITAL_COMMUNITY)
Admission: EM | Admit: 2021-07-11 | Discharge: 2021-07-12 | Disposition: A | Discharge status: Home / Self Care | Payer: Medicare Other | Attending: Emergency Medicine | Admitting: Emergency Medicine

## 2021-07-11 DIAGNOSIS — Z8616 Personal history of COVID-19: Secondary | ICD-10-CM | POA: Insufficient documentation

## 2021-07-11 DIAGNOSIS — Z96611 Presence of right artificial shoulder joint: Secondary | ICD-10-CM | POA: Diagnosis not present

## 2021-07-11 DIAGNOSIS — Z96653 Presence of artificial knee joint, bilateral: Secondary | ICD-10-CM | POA: Insufficient documentation

## 2021-07-11 DIAGNOSIS — N4889 Other specified disorders of penis: Secondary | ICD-10-CM | POA: Diagnosis not present

## 2021-07-11 DIAGNOSIS — R339 Retention of urine, unspecified: Secondary | ICD-10-CM | POA: Insufficient documentation

## 2021-07-11 DIAGNOSIS — R338 Other retention of urine: Secondary | ICD-10-CM | POA: Diagnosis not present

## 2021-07-11 DIAGNOSIS — M25561 Pain in right knee: Secondary | ICD-10-CM | POA: Diagnosis not present

## 2021-07-11 NOTE — ED Triage Notes (Signed)
Pt had a urinary catheter removed at urologist office this am at 0900.  Pt has not urinated since then. Pt called urology office and was told to come to ED for catheter placement.
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
## 2021-07-12 NOTE — ED Notes (Signed)
E-signature pad unavailable at time of pt discharge. This RN discussed discharge materials with pt and answered all pt questions. Pt stated understanding of discharge material. Standard drainage bag for indwelling foley catheter switched for leg bag by this RN.

## 2021-07-14 DIAGNOSIS — R338 Other retention of urine: Secondary | ICD-10-CM | POA: Diagnosis not present

## 2021-07-14 DIAGNOSIS — M25561 Pain in right knee: Secondary | ICD-10-CM | POA: Diagnosis not present

## 2021-07-14 DIAGNOSIS — N3000 Acute cystitis without hematuria: Secondary | ICD-10-CM | POA: Diagnosis not present

## 2021-07-14 DIAGNOSIS — R509 Fever, unspecified: Secondary | ICD-10-CM | POA: Diagnosis not present

## 2021-07-17 DIAGNOSIS — M25561 Pain in right knee: Secondary | ICD-10-CM | POA: Diagnosis not present

## 2021-07-21 DIAGNOSIS — M25561 Pain in right knee: Secondary | ICD-10-CM | POA: Diagnosis not present

## 2021-07-22 DIAGNOSIS — R338 Other retention of urine: Secondary | ICD-10-CM | POA: Diagnosis not present

## 2021-07-28 DIAGNOSIS — M25561 Pain in right knee: Secondary | ICD-10-CM | POA: Diagnosis not present

## 2021-07-30 DIAGNOSIS — R338 Other retention of urine: Secondary | ICD-10-CM | POA: Diagnosis not present

## 2021-08-01 DIAGNOSIS — R310 Gross hematuria: Secondary | ICD-10-CM | POA: Diagnosis not present

## 2021-08-04 DIAGNOSIS — M25561 Pain in right knee: Secondary | ICD-10-CM | POA: Diagnosis not present

## 2021-08-04 DIAGNOSIS — N3000 Acute cystitis without hematuria: Secondary | ICD-10-CM | POA: Diagnosis not present

## 2021-08-04 DIAGNOSIS — R310 Gross hematuria: Secondary | ICD-10-CM | POA: Diagnosis not present

## 2021-08-04 DIAGNOSIS — R338 Other retention of urine: Secondary | ICD-10-CM | POA: Diagnosis not present

## 2021-08-18 DIAGNOSIS — M25561 Pain in right knee: Secondary | ICD-10-CM | POA: Diagnosis not present

## 2021-08-26 DIAGNOSIS — M25561 Pain in right knee: Secondary | ICD-10-CM | POA: Diagnosis not present

## 2021-09-08 DIAGNOSIS — R338 Other retention of urine: Secondary | ICD-10-CM | POA: Diagnosis not present

## 2021-09-09 DIAGNOSIS — R338 Other retention of urine: Secondary | ICD-10-CM | POA: Diagnosis not present

## 2021-09-09 DIAGNOSIS — N3000 Acute cystitis without hematuria: Secondary | ICD-10-CM | POA: Diagnosis not present

## 2021-09-22 DIAGNOSIS — R338 Other retention of urine: Secondary | ICD-10-CM | POA: Diagnosis not present

## 2021-09-23 DIAGNOSIS — R339 Retention of urine, unspecified: Secondary | ICD-10-CM | POA: Diagnosis not present

## 2021-10-24 DIAGNOSIS — R339 Retention of urine, unspecified: Secondary | ICD-10-CM | POA: Diagnosis not present

## 2021-11-26 DIAGNOSIS — C61 Malignant neoplasm of prostate: Secondary | ICD-10-CM

## 2021-11-26 HISTORY — DX: Malignant neoplasm of prostate: C61

## 2021-12-15 DIAGNOSIS — R338 Other retention of urine: Secondary | ICD-10-CM | POA: Diagnosis not present

## 2021-12-15 DIAGNOSIS — N3000 Acute cystitis without hematuria: Secondary | ICD-10-CM | POA: Diagnosis not present

## 2021-12-25 ENCOUNTER — Other Ambulatory Visit (HOSPITAL_COMMUNITY): Payer: Self-pay | Admitting: Urology

## 2021-12-25 DIAGNOSIS — C61 Malignant neoplasm of prostate: Secondary | ICD-10-CM

## 2021-12-31 ENCOUNTER — Encounter (HOSPITAL_COMMUNITY)
Admission: RE | Admit: 2021-12-31 | Discharge: 2021-12-31 | Disposition: A | Discharge status: Home / Self Care | Payer: Medicare Other | Source: Ambulatory Visit | Attending: Urology | Admitting: Urology

## 2021-12-31 ENCOUNTER — Other Ambulatory Visit: Payer: Self-pay

## 2021-12-31 DIAGNOSIS — C61 Malignant neoplasm of prostate: Secondary | ICD-10-CM | POA: Insufficient documentation

## 2021-12-31 DIAGNOSIS — N3289 Other specified disorders of bladder: Secondary | ICD-10-CM | POA: Diagnosis not present

## 2021-12-31 DIAGNOSIS — R590 Localized enlarged lymph nodes: Secondary | ICD-10-CM | POA: Diagnosis not present

## 2021-12-31 MED ORDER — PIFLIFOLASTAT F 18 (PYLARIFY) INJECTION
9.0000 | Freq: Once | INTRAVENOUS | Status: AC
  Administered 2021-12-31: 9.3 via INTRAVENOUS

## 2022-01-01 DIAGNOSIS — M25512 Pain in left shoulder: Secondary | ICD-10-CM | POA: Diagnosis not present

## 2022-01-01 DIAGNOSIS — M7542 Impingement syndrome of left shoulder: Secondary | ICD-10-CM | POA: Diagnosis not present

## 2022-01-05 DIAGNOSIS — C778 Secondary and unspecified malignant neoplasm of lymph nodes of multiple regions: Secondary | ICD-10-CM | POA: Diagnosis not present

## 2022-01-07 DIAGNOSIS — R339 Retention of urine, unspecified: Secondary | ICD-10-CM | POA: Diagnosis not present

## 2022-01-12 DIAGNOSIS — Z1389 Encounter for screening for other disorder: Secondary | ICD-10-CM | POA: Diagnosis not present

## 2022-01-12 DIAGNOSIS — H353210 Exudative age-related macular degeneration, right eye, stage unspecified: Secondary | ICD-10-CM | POA: Diagnosis not present

## 2022-01-12 DIAGNOSIS — Z0000 Encounter for general adult medical examination without abnormal findings: Secondary | ICD-10-CM | POA: Diagnosis not present

## 2022-01-12 DIAGNOSIS — R339 Retention of urine, unspecified: Secondary | ICD-10-CM | POA: Diagnosis not present

## 2022-01-16 DIAGNOSIS — C775 Secondary and unspecified malignant neoplasm of intrapelvic lymph nodes: Secondary | ICD-10-CM | POA: Diagnosis not present

## 2022-01-16 DIAGNOSIS — C772 Secondary and unspecified malignant neoplasm of intra-abdominal lymph nodes: Secondary | ICD-10-CM | POA: Diagnosis not present

## 2022-02-11 DIAGNOSIS — C778 Secondary and unspecified malignant neoplasm of lymph nodes of multiple regions: Secondary | ICD-10-CM | POA: Diagnosis not present

## 2022-02-11 DIAGNOSIS — N3000 Acute cystitis without hematuria: Secondary | ICD-10-CM | POA: Diagnosis not present

## 2022-02-16 DIAGNOSIS — R339 Retention of urine, unspecified: Secondary | ICD-10-CM | POA: Diagnosis not present

## 2022-02-16 DIAGNOSIS — R232 Flushing: Secondary | ICD-10-CM | POA: Diagnosis not present

## 2022-02-16 DIAGNOSIS — C772 Secondary and unspecified malignant neoplasm of intra-abdominal lymph nodes: Secondary | ICD-10-CM | POA: Diagnosis not present

## 2022-02-16 DIAGNOSIS — C775 Secondary and unspecified malignant neoplasm of intrapelvic lymph nodes: Secondary | ICD-10-CM | POA: Diagnosis not present

## 2022-03-11 ENCOUNTER — Emergency Department (HOSPITAL_COMMUNITY)
Admission: EM | Admit: 2022-03-11 | Discharge: 2022-03-11 | Disposition: A | Discharge status: Home / Self Care | Payer: Medicare Other | Attending: Emergency Medicine | Admitting: Emergency Medicine

## 2022-03-11 ENCOUNTER — Emergency Department (HOSPITAL_COMMUNITY): Payer: Medicare Other

## 2022-03-11 DIAGNOSIS — Y92410 Unspecified street and highway as the place of occurrence of the external cause: Secondary | ICD-10-CM | POA: Insufficient documentation

## 2022-03-11 DIAGNOSIS — M79642 Pain in left hand: Secondary | ICD-10-CM | POA: Diagnosis not present

## 2022-03-11 DIAGNOSIS — S6992XA Unspecified injury of left wrist, hand and finger(s), initial encounter: Secondary | ICD-10-CM | POA: Diagnosis not present

## 2022-03-11 DIAGNOSIS — R0789 Other chest pain: Secondary | ICD-10-CM | POA: Diagnosis not present

## 2022-03-11 DIAGNOSIS — R079 Chest pain, unspecified: Secondary | ICD-10-CM | POA: Diagnosis not present
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
## 2022-03-11 NOTE — Discharge Instructions (Signed)
Follow-up with your primary care as needed and return with any worsening or emergent conditions. ? ?We hope you feel better! ?
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
## 2022-04-22 DIAGNOSIS — C775 Secondary and unspecified malignant neoplasm of intrapelvic lymph nodes: Secondary | ICD-10-CM | POA: Diagnosis not present

## 2022-04-22 DIAGNOSIS — C7951 Secondary malignant neoplasm of bone: Secondary | ICD-10-CM | POA: Diagnosis not present

## 2022-06-19 DIAGNOSIS — M25551 Pain in right hip: Secondary | ICD-10-CM | POA: Diagnosis not present

## 2022-07-02 DIAGNOSIS — N3000 Acute cystitis without hematuria: Secondary | ICD-10-CM | POA: Diagnosis not present

## 2022-08-17 DIAGNOSIS — N3020 Other chronic cystitis without hematuria: Secondary | ICD-10-CM | POA: Diagnosis not present

## 2022-08-24 IMAGING — CT NM PET TUM IMG SKULL BASE T - THIGH
1 of 7 series · 1 of 25 positions shown · non-contrast
Comparison: CT 06/10/2021

CLINICAL DATA: Prostate carcinoma. rising PSA equal 46 on
11/17/2021.

EXAM:
NUCLEAR MEDICINE PET SKULL BASE TO THIGH
TECHNIQUE: 9.8 mCi F18 Piflufolastat (Pylarify) was injected intravenously.
Full-ring PET imaging was performed from the skull base to thigh
after the radiotracer. CT data was obtained and used for attenuation
correction and anatomic localization.

[Series 3: pet sk_thigh ac · axial · 5.0mm · 4.07mm/px · 1 of 235 slices shown]
[im 118/235]
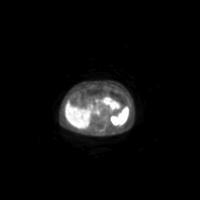

[1 of 25 positions shown; findings below may reference images not displayed]

FINDINGS: NECK

No radiotracer activity in neck lymph nodes.

Incidental CT finding: None

CHEST

No radiotracer accumulation within mediastinal or hilar lymph nodes.

Subtle radiotracer activity the posterior RIGHT lower lobe without
corresponding CT findings (SUV max equal 2.8) on image 93.

Incidental CT finding: None

ABDOMEN/PELVIS

Prostate: Intense radiotracer activity within the entirety of the
prostate gland with SUV max equal 66.1.

There is radiotracer avid thickening in the RIGHT posterior wall of
the bladder with SUV max equal 86.8. This hypermetabolic activity in
the RIGHT posterior wall the bladder is greater than urine
radiotracer activity consistent with local invasion of the bladder.

Lymph nodes: Enlarged intensely hypermetabolic LEFT external iliac
lymph nodes measuring up to 18 mm short axis with SUV max 81.

Small LEFT common iliac lymph node at the bifurcation measuring 4 mm
SUV max equal 14.9.

Bilateral internal iliac lymph nodes. For example in the RIGHT 6 mm
node with SUV max

Single radiotracer avid LEFT periaortic node measures 6 mm with SUV
max equal

Liver: No evidence of liver metastasis

Incidental CT finding: None

SKELETON

No focal  activity to suggest skeletal metastasis.
IMPRESSION: 1. Intense radiotracer activity within the prostate gland consistent
primary adenocarcinoma.
2. Evidence of local invasion of prostate carcinoma in the RIGHT
base of bladder.
3. Extensive bilateral internal and external radiotracer avid iliac
lymph nodes. Bulky LEFT external iliac adenopathy.
4. Solitary periaortic LEFT retroperitoneal metastatic node.
5. No skeletal metastasis.  No liver metastasis
6. A focus nonspecific activity in the RIGHT lung. Recommend
attention on follow-up.

## 2022-08-31 DIAGNOSIS — R590 Localized enlarged lymph nodes: Secondary | ICD-10-CM | POA: Diagnosis not present

## 2022-08-31 DIAGNOSIS — R591 Generalized enlarged lymph nodes: Secondary | ICD-10-CM | POA: Diagnosis not present

## 2022-08-31 DIAGNOSIS — C7951 Secondary malignant neoplasm of bone: Secondary | ICD-10-CM | POA: Diagnosis not present

## 2022-09-09 DIAGNOSIS — C772 Secondary and unspecified malignant neoplasm of intra-abdominal lymph nodes: Secondary | ICD-10-CM | POA: Diagnosis not present

## 2022-09-09 DIAGNOSIS — C775 Secondary and unspecified malignant neoplasm of intrapelvic lymph nodes: Secondary | ICD-10-CM | POA: Diagnosis not present

## 2022-09-21 DIAGNOSIS — C775 Secondary and unspecified malignant neoplasm of intrapelvic lymph nodes: Secondary | ICD-10-CM | POA: Diagnosis not present

## 2022-09-23 DIAGNOSIS — R059 Cough, unspecified: Secondary | ICD-10-CM | POA: Diagnosis not present

## 2022-09-23 DIAGNOSIS — R509 Fever, unspecified: Secondary | ICD-10-CM | POA: Diagnosis not present

## 2022-09-24 NOTE — Progress Notes (Signed)
GU Location of Tumor / Histology: Prostate Ca  If Prostate Cancer, Gleason Score is (5 + 4) and PSA is (0.11 as of 08/2022)  Biopsies      Past/Anticipated interventions by urology, if any:  NA  Past/Anticipated interventions by medical oncology, if any:  NA  Weight changes, if any:  No, weight gain due to medications.  IPSS:  5  Bowel/Bladder complaints, if any:  No  Nausea/Vomiting, if any:  No  Pain issues, if any: 0/10  SAFETY ISSUES: Prior radiation?  No Pacemaker/ICD?  No Possible current pregnancy?  Male Is the patient on methotrexate?  No  Current Complaints / other details:

## 2022-09-29 ENCOUNTER — Ambulatory Visit
Admission: RE | Admit: 2022-09-29 | Discharge: 2022-09-29 | Disposition: A | Discharge status: Home / Self Care | Payer: Medicare Other | Source: Ambulatory Visit | Attending: Radiation Oncology | Admitting: Radiation Oncology

## 2022-09-29 ENCOUNTER — Other Ambulatory Visit: Payer: Self-pay

## 2022-09-29 VITALS — BP 142/73 | HR 73 | Temp 97.5°F | Resp 20 | Ht 62.0 in | Wt 132.6 lb

## 2022-09-29 DIAGNOSIS — G629 Polyneuropathy, unspecified: Secondary | ICD-10-CM | POA: Insufficient documentation

## 2022-09-29 DIAGNOSIS — Z191 Hormone sensitive malignancy status: Secondary | ICD-10-CM | POA: Diagnosis not present

## 2022-09-29 DIAGNOSIS — Z8616 Personal history of COVID-19: Secondary | ICD-10-CM | POA: Insufficient documentation

## 2022-09-29 DIAGNOSIS — C778 Secondary and unspecified malignant neoplasm of lymph nodes of multiple regions: Secondary | ICD-10-CM | POA: Insufficient documentation

## 2022-09-29 DIAGNOSIS — C61 Malignant neoplasm of prostate: Secondary | ICD-10-CM | POA: Insufficient documentation

## 2022-09-29 DIAGNOSIS — M1990 Unspecified osteoarthritis, unspecified site: Secondary | ICD-10-CM | POA: Insufficient documentation

## 2022-09-29 DIAGNOSIS — R918 Other nonspecific abnormal finding of lung field: Secondary | ICD-10-CM | POA: Diagnosis not present

## 2022-09-29 DIAGNOSIS — E785 Hyperlipidemia, unspecified: Secondary | ICD-10-CM | POA: Insufficient documentation

## 2022-09-29 DIAGNOSIS — Z1509 Genetic susceptibility to other malignant neoplasm: Secondary | ICD-10-CM | POA: Diagnosis not present

## 2022-09-29 NOTE — Progress Notes (Signed)
GU Location of Tumor / Histology: Prostate Ca  If Prostate Cancer, Gleason Score is (5 + 4) and PSA is 0.11 on 08/2022)  Biopsies      Past/Anticipated interventions by urology, if any:  NA  Past/Anticipated interventions by medical oncology, if any: NA  Weight changes, if any: No weight loss had weight gain due to medications.  IPSS:  5  Bowel/Bladder complaints, if any: No   Nausea/Vomiting, if any: No  Pain issues, if any:  0/10  SAFETY ISSUES: Prior radiation? No Pacemaker/ICD? No Possible current pregnancy? Male Is the patient on methotrexate? No  Current Complaints / other details:
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
## 2022-09-30 ENCOUNTER — Other Ambulatory Visit: Payer: Self-pay | Admitting: Urology

## 2022-09-30 DIAGNOSIS — Z191 Hormone sensitive malignancy status: Secondary | ICD-10-CM | POA: Diagnosis not present

## 2022-10-01 MED ORDER — FLEET ENEMA 7-19 GM/118ML RE ENEM: 1.0000 | ENEMA | Freq: Once | RECTAL | Status: DC

## 2022-10-15 ENCOUNTER — Telehealth: Payer: Self-pay | Admitting: *Deleted

## 2022-10-15 NOTE — Telephone Encounter (Signed)
CALLED PATIENT TO INFORM OF FID. MARKERS TO BE PLACED ON 11/06/22 AND HIS SIM ON 11/12/22- ARRIVAL TIME- 10:45 AM @ [facility], INFORMED PATIENT TO ARRIVE WITH A FULL BLADDER, SPOKE WITH DAUGHTER FALU AND SHE IS AWARE OF THESE APPTS. AND THE INSTRUCTIONS

## 2022-11-03 ENCOUNTER — Encounter (HOSPITAL_BASED_OUTPATIENT_CLINIC_OR_DEPARTMENT_OTHER): Payer: Self-pay | Admitting: Urology

## 2022-11-04 ENCOUNTER — Encounter (HOSPITAL_BASED_OUTPATIENT_CLINIC_OR_DEPARTMENT_OTHER): Payer: Self-pay | Admitting: Urology

## 2022-11-04 NOTE — Progress Notes (Signed)
Spoke w/ via phone for pre-op interview--- pt's son, vinay and daughter, falu Pt speaks [place], son/ daughter interpreter for pt Lab needs dos----    no           Lab results------ no COVID test -----patient states asymptomatic no test needed Arrive at ------- 0745 on 11-06-2022 NPO after MN NO Solid Food.  Clear liquids from MN until--- 0645 (water only) Med rec completed Medications to take morning of surgery ----- none Diabetic medication ----- n/a Patient instructed no nail polish to be worn day of surgery Patient instructed to bring photo id and insurance card day of surgery Patient aware to have Driver (ride ) / caregiver    for 24 hours after surgery -- daughter, falu Patient Special Instructions ----- will fleet enema night before surgery per office instructions Pre-Op special Istructions -----  pt daughter will be interpreting for pt dos Patient verbalized understanding of instructions that were given at this phone interview. Patient denies shortness of breath, chest pain, fever, cough at this phone interview.

## 2022-11-05 ENCOUNTER — Other Ambulatory Visit: Payer: Self-pay | Admitting: Urology

## 2022-11-06 ENCOUNTER — Telehealth: Payer: Self-pay | Admitting: *Deleted

## 2022-11-06 ENCOUNTER — Encounter (HOSPITAL_BASED_OUTPATIENT_CLINIC_OR_DEPARTMENT_OTHER): Payer: Self-pay | Admitting: Urology

## 2022-11-06 ENCOUNTER — Ambulatory Visit (HOSPITAL_BASED_OUTPATIENT_CLINIC_OR_DEPARTMENT_OTHER)
Admission: RE | Admit: 2022-11-06 | Discharge: 2022-11-06 | Disposition: A | Discharge status: Home / Self Care | Payer: Medicare Other | Attending: Urology | Admitting: Urology

## 2022-11-06 ENCOUNTER — Encounter (HOSPITAL_BASED_OUTPATIENT_CLINIC_OR_DEPARTMENT_OTHER)
Admission: RE | Disposition: A | Discharge status: Home / Self Care | Payer: Self-pay | Source: Home / Self Care | Attending: Urology

## 2022-11-06 DIAGNOSIS — Z01818 Encounter for other preprocedural examination: Secondary | ICD-10-CM

## 2022-11-06 DIAGNOSIS — R351 Nocturia: Secondary | ICD-10-CM | POA: Diagnosis not present

## 2022-11-06 DIAGNOSIS — Z538 Procedure and treatment not carried out for other reasons: Secondary | ICD-10-CM | POA: Insufficient documentation

## 2022-11-06 DIAGNOSIS — N403 Nodular prostate with lower urinary tract symptoms: Secondary | ICD-10-CM | POA: Diagnosis not present

## 2022-11-06 DIAGNOSIS — N3020 Other chronic cystitis without hematuria: Secondary | ICD-10-CM | POA: Insufficient documentation

## 2022-11-06 DIAGNOSIS — H9190 Unspecified hearing loss, unspecified ear: Secondary | ICD-10-CM | POA: Insufficient documentation

## 2022-11-06 DIAGNOSIS — C61 Malignant neoplasm of prostate: Secondary | ICD-10-CM | POA: Insufficient documentation

## 2022-11-06 HISTORY — DX: Presence of dental prosthetic device (complete) (partial): Z97.2

## 2022-11-06 HISTORY — DX: Presence of spectacles and contact lenses: Z97.3

## 2022-11-06 HISTORY — DX: Personal history of other (healed) physical injury and trauma: Z87.828

## 2022-11-06 HISTORY — DX: Benign prostatic hyperplasia without lower urinary tract symptoms: N40.0

## 2022-11-06 HISTORY — DX: Presence of external hearing-aid: Z97.4

## 2022-11-06 LAB — URINALYSIS, ROUTINE W REFLEX MICROSCOPIC
Bilirubin Urine: NEGATIVE
Glucose, UA: NEGATIVE mg/dL
Ketones, ur: NEGATIVE mg/dL
Nitrite: POSITIVE — AB
Protein, ur: NEGATIVE mg/dL
Specific Gravity, Urine: 1.014 (ref 1.005–1.030)
pH: 6 (ref 5.0–8.0)

## 2022-11-06 SURGERY — INSERTION, GOLD SEEDS
Anesthesia: Monitor Anesthesia Care

## 2022-11-06 MED ORDER — LIDOCAINE HCL (PF) 2 % IJ SOLN
INTRAMUSCULAR | Status: AC
  Filled 2022-11-06: qty 5

## 2022-11-06 MED ORDER — CEFAZOLIN SODIUM-DEXTROSE 2-4 GM/100ML-% IV SOLN: 2.0000 g | INTRAVENOUS | Status: DC

## 2022-11-06 MED ORDER — LACTATED RINGERS IV SOLN: INTRAVENOUS | Status: DC

## 2022-11-06 MED ORDER — PROPOFOL 1000 MG/100ML IV EMUL
INTRAVENOUS | Status: AC
  Filled 2022-11-06: qty 100

## 2022-11-06 MED ORDER — FENTANYL CITRATE (PF) 100 MCG/2ML IJ SOLN
INTRAMUSCULAR | Status: AC
  Filled 2022-11-06: qty 2

## 2022-11-06 MED ORDER — ACETAMINOPHEN 500 MG PO TABS: 1000.0000 mg | ORAL_TABLET | Freq: Once | ORAL | Status: DC

## 2022-11-06 SURGICAL SUPPLY — 20 items
BLADE CLIPPER SENSICLIP SURGIC (BLADE) ×1 IMPLANT
CNTNR URN SCR LID CUP LEK RST (MISCELLANEOUS) ×1 IMPLANT
CONT SPEC 4OZ STRL OR WHT (MISCELLANEOUS) ×1
COVER BACK TABLE 60X90IN (DRAPES) ×1 IMPLANT
DRSG TEGADERM 4X4.75 (GAUZE/BANDAGES/DRESSINGS) ×1 IMPLANT
DRSG TEGADERM 8X12 (GAUZE/BANDAGES/DRESSINGS) ×1 IMPLANT
GAUZE SPONGE 4X4 12PLY STRL (GAUZE/BANDAGES/DRESSINGS) ×1 IMPLANT
GAUZE SPONGE 4X4 12PLY STRL LF (GAUZE/BANDAGES/DRESSINGS) ×1 IMPLANT
GLOVE ECLIPSE 8.0 STRL XLNG CF (GLOVE) ×1 IMPLANT
GLOVE SURG SS PI 8.0 STRL IVOR (GLOVE) ×1 IMPLANT
KIT TURNOVER CYSTO (KITS) ×1 IMPLANT
MARKER SKIN DUAL TIP RULER LAB (MISCELLANEOUS) ×1 IMPLANT
NEEDLE SPNL 22GX3.5 QUINCKE BK (NEEDLE) ×1 IMPLANT
SHEATH ULTRASOUND LF (SHEATH) IMPLANT
SHEATH ULTRASOUND LTX NONSTRL (SHEATH) IMPLANT
SURGILUBE 2OZ TUBE FLIPTOP (MISCELLANEOUS) ×1 IMPLANT
SYR 10ML LL (SYRINGE) IMPLANT
SYR CONTROL 10ML LL (SYRINGE) ×1 IMPLANT
TOWEL OR 17X26 10 PK STRL BLUE (TOWEL DISPOSABLE) ×1 IMPLANT
UNDERPAD 30X36 HEAVY ABSORB (UNDERPADS AND DIAPERS) ×1 IMPLANT

## 2022-11-06 NOTE — Anesthesia Preprocedure Evaluation (Addendum)
Anesthesia Evaluation  Patient identified by MRN, date of birth, ID band Patient awake    Reviewed: Allergy & Precautions, H&P , NPO status , Patient's Chart, lab work & pertinent test results  History of Anesthesia Complications Negative for: history of anesthetic complications  Airway Mallampati: II   Neck ROM: full    Dental   Pulmonary neg pulmonary ROS   breath sounds clear to auscultation       Cardiovascular negative cardio ROS  Rhythm:regular Rate:Normal     Neuro/Psych  Hearing loss   Neuromuscular disease  negative psych ROS   GI/Hepatic Neg liver ROS,GERD  ,,  Endo/Other  negative endocrine ROS    Renal/GU negative Renal ROS    Prostate cancer     Musculoskeletal  (+) Arthritis , Osteoarthritis,    Abdominal   Peds  Hematology negative hematology ROS (+)   Anesthesia Other Findings   Reproductive/Obstetrics                             Anesthesia Physical Anesthesia Plan  ASA: 3  Anesthesia Plan: MAC   Post-op Pain Management: Tylenol PO (pre-op)*   Induction: Intravenous  PONV Risk Score and Plan: 1 and Propofol infusion and Treatment may vary due to age or medical condition  Airway Management Planned: Nasal Cannula and Natural Airway  Additional Equipment: None  Intra-op Plan:   Post-operative Plan:   Informed Consent: I have reviewed the patients History and Physical, chart, labs and discussed the procedure including the risks, benefits and alternatives for the proposed anesthesia with the patient or authorized representative who has indicated his/her understanding and acceptance.     Dental advisory given  Plan Discussed with: CRNA, Anesthesiologist and Surgeon  Anesthesia Plan Comments:        Anesthesia Quick Evaluation

## 2022-11-06 NOTE — Telephone Encounter (Signed)
Returned patient's son phone call, spoke with patient's son Michael Burch
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
## 2022-11-08 LAB — URINE CULTURE: Culture: >=100000 — AB

## 2022-11-09 ENCOUNTER — Other Ambulatory Visit: Payer: Self-pay | Admitting: Urology

## 2022-11-10 ENCOUNTER — Encounter (HOSPITAL_BASED_OUTPATIENT_CLINIC_OR_DEPARTMENT_OTHER): Payer: Self-pay | Admitting: Urology

## 2022-11-10 ENCOUNTER — Other Ambulatory Visit: Payer: Self-pay | Admitting: Urology

## 2022-11-10 NOTE — Progress Notes (Signed)
Spoke w/ via phone for pre-op interview--- pt's son,Michael Burch Pt speaks [place], son/ daughter interpreter for pt Lab needs dos----    no           Lab results------ no COVID test -----patient states asymptomatic no test needed Arrive at ------- 0930 on 11-13-2022 NPO after MN NO Solid Food.  Clear liquids from MN until--- 0830 (water only) Med rec completed Medications to take morning of surgery ----- none Diabetic medication ----- n/a Patient instructed no nail polish to be worn day of surgery Patient instructed to bring photo id and insurance card day of surgery Patient aware to have Driver (ride ) / caregiver    for 24 hours after surgery -- daughter, Michael Burch Patient Special Instructions ----- will fleet enema night before surgery per office instructions Pre-Op special Istructions -----  pt daughter will be interpreting for pt dos Patient verbalized understanding of instructions that were given at this phone interview. Patient denies shortness of breath, chest pain, fever, cough at this phone interview.

## 2022-11-12 ENCOUNTER — Ambulatory Visit: Payer: Medicare Other | Admitting: Radiation Oncology

## 2022-11-13 ENCOUNTER — Ambulatory Visit (HOSPITAL_BASED_OUTPATIENT_CLINIC_OR_DEPARTMENT_OTHER): Payer: Medicare Other | Admitting: Anesthesiology

## 2022-11-13 ENCOUNTER — Encounter (HOSPITAL_BASED_OUTPATIENT_CLINIC_OR_DEPARTMENT_OTHER): Payer: Self-pay | Admitting: Urology

## 2022-11-13 ENCOUNTER — Ambulatory Visit (HOSPITAL_BASED_OUTPATIENT_CLINIC_OR_DEPARTMENT_OTHER)
Admission: RE | Admit: 2022-11-13 | Discharge: 2022-11-13 | Disposition: A | Discharge status: Home / Self Care | Payer: Medicare Other | Attending: Urology | Admitting: Urology

## 2022-11-13 ENCOUNTER — Other Ambulatory Visit: Payer: Self-pay

## 2022-11-13 ENCOUNTER — Encounter (HOSPITAL_BASED_OUTPATIENT_CLINIC_OR_DEPARTMENT_OTHER)
Admission: RE | Disposition: A | Discharge status: Home / Self Care | Payer: Self-pay | Source: Home / Self Care | Attending: Urology

## 2022-11-13 DIAGNOSIS — H9190 Unspecified hearing loss, unspecified ear: Secondary | ICD-10-CM | POA: Diagnosis not present

## 2022-11-13 DIAGNOSIS — N403 Nodular prostate with lower urinary tract symptoms: Secondary | ICD-10-CM | POA: Diagnosis not present

## 2022-11-13 DIAGNOSIS — Z538 Procedure and treatment not carried out for other reasons: Secondary | ICD-10-CM | POA: Diagnosis not present

## 2022-11-13 DIAGNOSIS — C61 Malignant neoplasm of prostate: Secondary | ICD-10-CM | POA: Insufficient documentation

## 2022-11-13 DIAGNOSIS — R351 Nocturia: Secondary | ICD-10-CM | POA: Diagnosis not present

## 2022-11-13 DIAGNOSIS — Z87440 Personal history of urinary (tract) infections: Secondary | ICD-10-CM | POA: Insufficient documentation

## 2022-11-13 DIAGNOSIS — N3020 Other chronic cystitis without hematuria: Secondary | ICD-10-CM | POA: Insufficient documentation

## 2022-11-13 DIAGNOSIS — M1990 Unspecified osteoarthritis, unspecified site: Secondary | ICD-10-CM | POA: Diagnosis not present

## 2022-11-13 DIAGNOSIS — Z01818 Encounter for other preprocedural examination: Secondary | ICD-10-CM

## 2022-11-13 DIAGNOSIS — C778 Secondary and unspecified malignant neoplasm of lymph nodes of multiple regions: Secondary | ICD-10-CM | POA: Diagnosis not present

## 2022-11-13 HISTORY — PX: GOLD SEED IMPLANT: SHX6343

## 2022-11-13 SURGERY — INSERTION, GOLD SEEDS
Anesthesia: Monitor Anesthesia Care | Site: Prostate

## 2022-11-13 MED ORDER — CEFAZOLIN SODIUM-DEXTROSE 2-4 GM/100ML-% IV SOLN
INTRAVENOUS | Status: AC
  Filled 2022-11-13: qty 100

## 2022-11-13 MED ORDER — SODIUM CHLORIDE 0.9 % IV SOLN
3.0000 g | Freq: Once | INTRAVENOUS | Status: AC
  Administered 2022-11-13: 3 g via INTRAVENOUS

## 2022-11-13 MED ORDER — ACETAMINOPHEN 325 MG RE SUPP: 650.0000 mg | RECTAL | Status: DC | PRN

## 2022-11-13 MED ORDER — AMPICILLIN-SULBACTAM SODIUM 3 (2-1) G IJ SOLR
INTRAMUSCULAR | Status: AC
  Filled 2022-11-13: qty 8

## 2022-11-13 MED ORDER — FENTANYL CITRATE (PF) 100 MCG/2ML IJ SOLN: 25.0000 ug | INTRAMUSCULAR | Status: DC | PRN

## 2022-11-13 MED ORDER — SODIUM CHLORIDE 0.9 % IV SOLN: 250.0000 mL | INTRAVENOUS | Status: DC | PRN

## 2022-11-13 MED ORDER — LIDOCAINE HCL (CARDIAC) PF 100 MG/5ML IV SOSY
PREFILLED_SYRINGE | INTRAVENOUS | Status: DC | PRN
  Administered 2022-11-13: 50 mg via INTRAVENOUS

## 2022-11-13 MED ORDER — OXYCODONE HCL 5 MG PO TABS: 5.0000 mg | ORAL_TABLET | ORAL | Status: DC | PRN

## 2022-11-13 MED ORDER — LACTATED RINGERS IV SOLN: INTRAVENOUS | Status: DC

## 2022-11-13 MED ORDER — ONDANSETRON HCL 4 MG/2ML IJ SOLN
INTRAMUSCULAR | Status: DC | PRN
  Administered 2022-11-13: 4 mg via INTRAVENOUS

## 2022-11-13 MED ORDER — PHENYLEPHRINE 80 MCG/ML (10ML) SYRINGE FOR IV PUSH (FOR BLOOD PRESSURE SUPPORT)
PREFILLED_SYRINGE | INTRAVENOUS | Status: DC | PRN
  Administered 2022-11-13: 80 ug via INTRAVENOUS

## 2022-11-13 MED ORDER — MORPHINE SULFATE (PF) 4 MG/ML IV SOLN: 2.0000 mg | INTRAVENOUS | Status: DC | PRN

## 2022-11-13 MED ORDER — FENTANYL CITRATE (PF) 100 MCG/2ML IJ SOLN
INTRAMUSCULAR | Status: DC | PRN
  Administered 2022-11-13 (×2): 25 ug via INTRAVENOUS

## 2022-11-13 MED ORDER — ONDANSETRON HCL 4 MG/2ML IJ SOLN
INTRAMUSCULAR | Status: AC
  Filled 2022-11-13: qty 2

## 2022-11-13 MED ORDER — ONDANSETRON HCL 4 MG/2ML IJ SOLN: 4.0000 mg | Freq: Four times a day (QID) | INTRAMUSCULAR | Status: DC | PRN

## 2022-11-13 MED ORDER — KETAMINE HCL 10 MG/ML IJ SOLN
INTRAMUSCULAR | Status: DC | PRN
  Administered 2022-11-13 (×2): 10 mg via INTRAVENOUS

## 2022-11-13 MED ORDER — SODIUM CHLORIDE 0.9 % IV SOLN
INTRAVENOUS | Status: DC | PRN
  Administered 2022-11-13: 3 g via INTRAVENOUS

## 2022-11-13 MED ORDER — LIDOCAINE HCL (PF) 2 % IJ SOLN
INTRAMUSCULAR | Status: AC
  Filled 2022-11-13: qty 5

## 2022-11-13 MED ORDER — KETAMINE HCL 50 MG/5ML IJ SOSY
PREFILLED_SYRINGE | INTRAMUSCULAR | Status: AC
  Filled 2022-11-13: qty 5

## 2022-11-13 MED ORDER — PROPOFOL 500 MG/50ML IV EMUL
INTRAVENOUS | Status: DC | PRN
  Administered 2022-11-13: 100 ug/kg/min via INTRAVENOUS

## 2022-11-13 MED ORDER — ACETAMINOPHEN 325 MG PO TABS: 650.0000 mg | ORAL_TABLET | ORAL | Status: DC | PRN

## 2022-11-13 MED ORDER — LIDOCAINE HCL 2 % IJ SOLN
INTRAMUSCULAR | Status: DC | PRN
  Administered 2022-11-13: 10 mL

## 2022-11-13 MED ORDER — FENTANYL CITRATE (PF) 100 MCG/2ML IJ SOLN
INTRAMUSCULAR | Status: AC
  Filled 2022-11-13: qty 2

## 2022-11-13 MED ORDER — SODIUM CHLORIDE 0.9 % IV SOLN
INTRAVENOUS | Status: AC
  Filled 2022-11-13: qty 100

## 2022-11-13 MED ORDER — SODIUM CHLORIDE 0.9% FLUSH: 3.0000 mL | Freq: Two times a day (BID) | INTRAVENOUS | Status: DC

## 2022-11-13 MED ORDER — PROPOFOL 500 MG/50ML IV EMUL
INTRAVENOUS | Status: AC
  Filled 2022-11-13: qty 50

## 2022-11-13 MED ORDER — OXYCODONE HCL 5 MG PO TABS: 5.0000 mg | ORAL_TABLET | Freq: Once | ORAL | Status: DC | PRN

## 2022-11-13 MED ORDER — OXYCODONE HCL 5 MG/5ML PO SOLN: 5.0000 mg | Freq: Once | ORAL | Status: DC | PRN

## 2022-11-13 MED ORDER — FLEET ENEMA 7-19 GM/118ML RE ENEM: 1.0000 | ENEMA | Freq: Once | RECTAL | Status: DC

## 2022-11-13 MED ORDER — CEFAZOLIN SODIUM-DEXTROSE 2-4 GM/100ML-% IV SOLN: 2.0000 g | INTRAVENOUS | Status: DC

## 2022-11-13 MED ORDER — SODIUM CHLORIDE 0.9% FLUSH: 3.0000 mL | INTRAVENOUS | Status: DC | PRN

## 2022-11-13 MED ORDER — PROPOFOL 10 MG/ML IV BOLUS
INTRAVENOUS | Status: DC | PRN
  Administered 2022-11-13: 50 mg via INTRAVENOUS
  Administered 2022-11-13 (×3): 25 mg via INTRAVENOUS

## 2022-11-13 SURGICAL SUPPLY — 20 items
CNTNR URN SCR LID CUP LEK RST (MISCELLANEOUS) ×1 IMPLANT
CONT SPEC 4OZ STRL OR WHT (MISCELLANEOUS) ×1
COVER BACK TABLE 60X90IN (DRAPES) ×1 IMPLANT
DRSG TEGADERM 4X4.75 (GAUZE/BANDAGES/DRESSINGS) ×1 IMPLANT
DRSG TEGADERM 8X12 (GAUZE/BANDAGES/DRESSINGS) ×1 IMPLANT
GAUZE SPONGE 4X4 12PLY STRL (GAUZE/BANDAGES/DRESSINGS) ×1 IMPLANT
GLOVE BIOGEL PI IND STRL 7.0 (GLOVE) IMPLANT
GLOVE SURG SS PI 8.0 STRL IVOR (GLOVE) ×1 IMPLANT
KIT TURNOVER CYSTO (KITS) ×1 IMPLANT
MARKER GOLD PRELOAD 1.2X3 (Urological Implant) ×1 IMPLANT
MARKER SKIN DUAL TIP RULER LAB (MISCELLANEOUS) ×1 IMPLANT
NDL SPNL 22GX3.5 QUINCKE BK (NEEDLE) ×1 IMPLANT
NEEDLE SPNL 22GX3.5 QUINCKE BK (NEEDLE) ×1 IMPLANT
SEED GOLD PRELOAD 1.2X3 (Urological Implant) ×1 IMPLANT
SHEATH ULTRASOUND LF (SHEATH) IMPLANT
SPIKE FLUID TRANSFER (MISCELLANEOUS) IMPLANT
SURGILUBE 2OZ TUBE FLIPTOP (MISCELLANEOUS) ×1 IMPLANT
SYR CONTROL 10ML LL (SYRINGE) ×1 IMPLANT
TOWEL OR 17X26 10 PK STRL BLUE (TOWEL DISPOSABLE) ×1 IMPLANT
UNDERPAD 30X36 HEAVY ABSORB (UNDERPADS AND DIAPERS) ×1 IMPLANT

## 2022-11-13 NOTE — Interval H&P Note (Signed)
History and Physical Interval Note:  He is on macrobid and will get Unasyn for his positive culture.   11/13/2022 11:02 AM  Michael Burch  has presented today for surgery, with the diagnosis of PROSTATE CANCER.  The various methods of treatment have been discussed with the patient and family. After consideration of risks, benefits and other options for treatment, the patient has consented to  Procedure(s): GOLD SEED IMPLANT (N/A) as a surgical intervention.  The patient's history has been reviewed, patient examined, no change in status, stable for surgery.  I have reviewed the patient's chart and labs.  Questions were answered to the patient's satisfaction.     Irine Seal

## 2022-11-13 NOTE — Op Note (Signed)
Procedure: Prostate ultrasound-guided placement of gold fiducial markers.  Preop diagnosis: Prostate cancer.  Postop diagnosis: Same.  Surgeon: Dr. Irine Seal.  Anesthesia: MAC and local.  Specimen: None.  Drain: None.  EBL: None.  Indications: The patient is a 82 year old male with metastatic prostate cancer who is to undergo treatment of the primary with radiation therapy noted he has responded to systemic therapy.  Gold seeds were requested for radiation therapy targeting.  He has a history of a multidrug-resistant E. coli with his last culture a week ago.  He was started on Macrobid which was the only oral agent with sensitivities and will receive Unasyn today per culture.  Procedure: He was given Unasyn 3 g.  He was taken to the operating room where he was given sedation as needed.  He was placed in the lithotomy position.  His scrotum was secured superiorly with an OpSite and towel.  His perineum was clipped and then prepped with Betadine solution.  The 10 MHz ultrasound probe was assembled and inserted and the prostate was localized in both the transverse and sagittal planes.  The perineum was infiltrated with approximately 5 mL of 2% lidocaine, down to the prostatic apex.  The fiducial markers were then passed under ultrasound guidance and there are needles into the right base of the prostate, the right apex of the prostate and the left lateral mid prostate.  His prostate particularly on the right side was very indurated and the placement of the base seed was more lateral than medial because of the difficulty passing the needle through the indurated prostate.  Once the markers were in place, the probe was removed.  The perineum was cleansed and a dressing was applied.  He was taken down from lithotomy position and moved to recovery room in stable condition.  There were no complications.

## 2022-11-13 NOTE — Transfer of Care (Signed)
Immediate Anesthesia Transfer of Care Note  Patient: Michael Burch  Procedure(s) Performed: Procedure(s) (LRB): GOLD SEED IMPLANT (N/A)  Patient Location: PACU  Anesthesia Type: MAC  Level of Consciousness: sleepy  Airway & Oxygen Therapy: Patient Spontanous Breathing and Patient connected to face mask oxygen, oral airway remaining  Post-op Assessment: Report given to PACU RN and Post -op Vital signs reviewed and stable  Post vital signs: Reviewed and stable  Complications: No apparent anesthesia complications  Last Vitals:  Vitals Value Taken Time  BP 124/76 11/13/22 1201  Temp    Pulse 58 11/13/22 1206  Resp 15 11/13/22 1206  SpO2 100 % 11/13/22 1206  Vitals shown include unvalidated device data.  Last Pain:  Vitals:   11/13/22 0936  TempSrc: Oral  PainSc: 0-No pain      Patients Stated Pain Goal: 2 (24/40/10 2725)  Complications: No notable events documented.

## 2022-11-13 NOTE — Discharge Instructions (Addendum)
Call for fever >101, heavy bleeding or difficulty urinating.        Post Anesthesia Home Care Instructions  Activity: Get plenty of rest for the remainder of the day. A responsible individual must stay with you for 24 hours following the procedure.  For the next 24 hours, DO NOT: -Drive a car -Paediatric nurse -Drink alcoholic beverages -Take any medication unless instructed by your physician -Make any legal decisions or sign important papers.  Meals: Start with liquid foods such as gelatin or soup. Progress to regular foods as tolerated. Avoid greasy, spicy, heavy foods. If nausea and/or vomiting occur, drink only clear liquids until the nausea and/or vomiting subsides. Call your physician if vomiting continues.  Special Instructions/Symptoms: Your throat may feel dry or sore from the anesthesia or the breathing tube placed in your throat during surgery. If this causes discomfort, gargle with warm salt water. The discomfort should disappear within 24 hours.

## 2022-11-13 NOTE — Anesthesia Procedure Notes (Addendum)
Procedure Name: MAC Date/Time: 11/13/2022 11:38 AM  Performed by: Mechele Claude, CRNAPre-anesthesia Checklist: Patient identified, Emergency Drugs available, Suction available and Patient being monitored Oxygen Delivery Method: Simple face mask Airway Equipment and Method: Oral airway Placement Confirmation: positive ETCO2 and breath sounds checked- equal and bilateral

## 2022-11-16 ENCOUNTER — Encounter (HOSPITAL_BASED_OUTPATIENT_CLINIC_OR_DEPARTMENT_OTHER): Payer: Self-pay | Admitting: Urology

## 2022-11-16 NOTE — Anesthesia Postprocedure Evaluation (Signed)
Anesthesia Post Note  Patient: Michael Burch  Procedure(s) Performed: GOLD SEED IMPLANT (Prostate)     Patient location during evaluation: PACU Anesthesia Type: MAC Level of consciousness: awake and alert Pain management: pain level controlled Vital Signs Assessment: post-procedure vital signs reviewed and stable Respiratory status: spontaneous breathing, nonlabored ventilation, respiratory function stable and patient connected to nasal cannula oxygen Cardiovascular status: stable and blood pressure returned to baseline Postop Assessment: no apparent nausea or vomiting Anesthetic complications: no   No notable events documented.  Last Vitals:  Vitals:   11/13/22 1322 11/13/22 1345  BP:  (!) 166/87  Pulse: 64 77  Resp: 12 14  Temp: 36.6 C 36.5 C  SpO2: 100% 100%    Last Pain:  Vitals:   11/13/22 1345  TempSrc:   PainSc: 0-No pain                 Tammie Ellsworth S

## 2022-11-25 ENCOUNTER — Telehealth: Payer: Self-pay | Admitting: *Deleted

## 2022-11-25 NOTE — Telephone Encounter (Signed)
CALLED PATIENT TO REMIND OF SIM APPT. FOR 11-27-22- ARRIVAL TIME- 10:45 AM @ [facility], INFORMED PATIENT TO ARRIVE WITH A FULL BLADDER, SPOKE WITH PATIENT AND HE IS AWARE OF THIS APPT. AND THE INSTRUCTIONS

## 2022-11-26 ENCOUNTER — Ambulatory Visit: Payer: Medicare Other | Admitting: Radiation Oncology

## 2022-11-26 NOTE — Progress Notes (Signed)
  Radiation Oncology         [phone] ________________________________  Name: Michael Burch MRN: [id number]  Date: 11/27/2022  DOB: 03-25-1941  SIMULATION AND TREATMENT PLANNING NOTE    ICD-10-CM   1. Malignant neoplasm of prostate ([place])  C61       DIAGNOSIS:  82 y.o. gentleman with Stage IV, T4M1a metastatic prostate cancer involving retroperitoneal and pelvic lymph nodes with a current PSA of 0.11 on ADT with total androgen receptor blockade.   NARRATIVE:  The patient was brought to the [facility].  Identity was confirmed.  All relevant records and images related to the planned course of therapy were reviewed.  The patient freely provided informed written consent to proceed with treatment after reviewing the details related to the planned course of therapy. The consent form was witnessed and verified by the simulation staff.  Then, the patient was set-up in a stable reproducible supine position for radiation therapy.  A vacuum lock pillow device was custom fabricated to position his legs in a reproducible immobilized position.  Then, I performed a urethrogram under sterile conditions to identify the prostatic apex.  CT images were obtained.  Surface markings were placed.  The CT images were loaded into the planning software.  Then the prostate target and avoidance structures including the rectum, bladder, bowel and hips were contoured.  Treatment planning then occurred.  The radiation prescription was entered and confirmed.  A total of one complex treatment devices was fabricated. I have requested : Intensity Modulated Radiotherapy (IMRT) is medically necessary for this case for the following reason:  Rectal sparing.  PLAN: The prostate, seminal vesicles, and currently PET-avid involved pelvic lymph nodes will be treated to 55 Gy in 20 fractions of 2.75 Gy using a simultaneous integrated boost, while the electively treated nodal echelons, iliac, para-aortic will receive  44 Gy in 20 fraction of 2.2 Gy.  The November PET was fused into the planning CT to define currently PET-avid disease while the initial March PET was cognitively fused to ensure that the elective nodal volume encompassed all of the initial nodal sites of disease which have responded, including the left obturator fossa node and the retroperitoneal node anterior to L4. ________________________________  Sheral Apley Tammi Klippel, M.D.

## 2022-11-27 ENCOUNTER — Ambulatory Visit
Admission: RE | Admit: 2022-11-27 | Discharge: 2022-11-27 | Disposition: A | Discharge status: Home / Self Care | Payer: Medicare Other | Source: Ambulatory Visit | Attending: Radiation Oncology | Admitting: Radiation Oncology

## 2022-11-27 DIAGNOSIS — Z510 Encounter for antineoplastic radiation therapy: Secondary | ICD-10-CM | POA: Diagnosis not present

## 2022-11-27 DIAGNOSIS — C61 Malignant neoplasm of prostate: Secondary | ICD-10-CM | POA: Diagnosis present

## 2022-11-27 DIAGNOSIS — Z191 Hormone sensitive malignancy status: Secondary | ICD-10-CM | POA: Diagnosis not present

## 2022-11-30 ENCOUNTER — Other Ambulatory Visit: Payer: Self-pay

## 2022-11-30 ENCOUNTER — Ambulatory Visit
Admission: RE | Admit: 2022-11-30 | Discharge: 2022-11-30 | Disposition: A | Discharge status: Home / Self Care | Payer: Self-pay | Source: Ambulatory Visit | Attending: Radiation Oncology | Admitting: Radiation Oncology

## 2022-11-30 DIAGNOSIS — C61 Malignant neoplasm of prostate: Secondary | ICD-10-CM

## 2022-12-07 DIAGNOSIS — Z191 Hormone sensitive malignancy status: Secondary | ICD-10-CM | POA: Diagnosis not present

## 2022-12-07 DIAGNOSIS — Z510 Encounter for antineoplastic radiation therapy: Secondary | ICD-10-CM | POA: Diagnosis not present

## 2022-12-08 ENCOUNTER — Ambulatory Visit
Admission: RE | Admit: 2022-12-08 | Discharge: 2022-12-08 | Disposition: A | Discharge status: Home / Self Care | Payer: Medicare Other | Source: Ambulatory Visit | Attending: Radiation Oncology | Admitting: Radiation Oncology

## 2022-12-08 ENCOUNTER — Other Ambulatory Visit: Payer: Self-pay

## 2022-12-08 DIAGNOSIS — Z191 Hormone sensitive malignancy status: Secondary | ICD-10-CM | POA: Diagnosis not present

## 2022-12-08 DIAGNOSIS — C61 Malignant neoplasm of prostate: Secondary | ICD-10-CM

## 2022-12-08 DIAGNOSIS — Z510 Encounter for antineoplastic radiation therapy: Secondary | ICD-10-CM | POA: Diagnosis not present

## 2022-12-08 LAB — RAD ONC ARIA SESSION SUMMARY
Course Elapsed Days: 0
Plan Fractions Treated to Date: 1
Plan Prescribed Dose Per Fraction: 2.75 Gy
Plan Total Fractions Prescribed: 20
Plan Total Prescribed Dose: 55 Gy
Reference Point Dosage Given to Date: 2.75 Gy
Reference Point Session Dosage Given: 2.75 Gy
Session Number: 1

## 2022-12-09 ENCOUNTER — Ambulatory Visit
Admission: RE | Admit: 2022-12-09 | Discharge: 2022-12-09 | Disposition: A | Discharge status: Home / Self Care | Payer: Medicare Other | Source: Ambulatory Visit | Attending: Radiation Oncology | Admitting: Radiation Oncology

## 2022-12-09 ENCOUNTER — Other Ambulatory Visit: Payer: Self-pay

## 2022-12-09 DIAGNOSIS — C7951 Secondary malignant neoplasm of bone: Secondary | ICD-10-CM | POA: Diagnosis not present

## 2022-12-09 DIAGNOSIS — Z191 Hormone sensitive malignancy status: Secondary | ICD-10-CM | POA: Diagnosis not present

## 2022-12-09 DIAGNOSIS — Z510 Encounter for antineoplastic radiation therapy: Secondary | ICD-10-CM | POA: Diagnosis not present

## 2022-12-09 DIAGNOSIS — C775 Secondary and unspecified malignant neoplasm of intrapelvic lymph nodes: Secondary | ICD-10-CM | POA: Diagnosis not present

## 2022-12-09 LAB — RAD ONC ARIA SESSION SUMMARY
Course Elapsed Days: 1
Plan Fractions Treated to Date: 2
Plan Prescribed Dose Per Fraction: 2.75 Gy
Plan Total Fractions Prescribed: 20
Plan Total Prescribed Dose: 55 Gy
Reference Point Dosage Given to Date: 5.5 Gy
Reference Point Session Dosage Given: 2.75 Gy
Session Number: 2

## 2022-12-10 ENCOUNTER — Ambulatory Visit
Admission: RE | Admit: 2022-12-10 | Discharge: 2022-12-10 | Disposition: A | Discharge status: Home / Self Care | Payer: Medicare Other | Source: Ambulatory Visit | Attending: Radiation Oncology | Admitting: Radiation Oncology

## 2022-12-10 ENCOUNTER — Other Ambulatory Visit: Payer: Self-pay

## 2022-12-10 DIAGNOSIS — Z191 Hormone sensitive malignancy status: Secondary | ICD-10-CM | POA: Diagnosis not present

## 2022-12-10 DIAGNOSIS — Z510 Encounter for antineoplastic radiation therapy: Secondary | ICD-10-CM | POA: Diagnosis not present

## 2022-12-10 LAB — RAD ONC ARIA SESSION SUMMARY
Course Elapsed Days: 2
Plan Fractions Treated to Date: 3
Plan Prescribed Dose Per Fraction: 2.75 Gy
Plan Total Fractions Prescribed: 20
Plan Total Prescribed Dose: 55 Gy
Reference Point Dosage Given to Date: 8.25 Gy
Reference Point Session Dosage Given: 2.75 Gy
Session Number: 3

## 2022-12-11 ENCOUNTER — Other Ambulatory Visit: Payer: Self-pay

## 2022-12-11 ENCOUNTER — Ambulatory Visit
Admission: RE | Admit: 2022-12-11 | Discharge: 2022-12-11 | Disposition: A | Discharge status: Home / Self Care | Payer: Medicare Other | Source: Ambulatory Visit | Attending: Radiation Oncology | Admitting: Radiation Oncology

## 2022-12-11 DIAGNOSIS — Z510 Encounter for antineoplastic radiation therapy: Secondary | ICD-10-CM | POA: Diagnosis not present

## 2022-12-11 DIAGNOSIS — Z191 Hormone sensitive malignancy status: Secondary | ICD-10-CM | POA: Diagnosis not present

## 2022-12-11 LAB — RAD ONC ARIA SESSION SUMMARY
Course Elapsed Days: 3
Plan Fractions Treated to Date: 4
Plan Prescribed Dose Per Fraction: 2.75 Gy
Plan Total Fractions Prescribed: 20
Plan Total Prescribed Dose: 55 Gy
Reference Point Dosage Given to Date: 11 Gy
Reference Point Session Dosage Given: 2.75 Gy
Session Number: 4

## 2022-12-14 ENCOUNTER — Other Ambulatory Visit: Payer: Self-pay

## 2022-12-14 ENCOUNTER — Ambulatory Visit
Admission: RE | Admit: 2022-12-14 | Discharge: 2022-12-14 | Disposition: A | Discharge status: Home / Self Care | Payer: Medicare Other | Source: Ambulatory Visit | Attending: Radiation Oncology | Admitting: Radiation Oncology

## 2022-12-14 DIAGNOSIS — Z191 Hormone sensitive malignancy status: Secondary | ICD-10-CM | POA: Diagnosis not present

## 2022-12-14 DIAGNOSIS — Z510 Encounter for antineoplastic radiation therapy: Secondary | ICD-10-CM | POA: Diagnosis not present

## 2022-12-14 LAB — RAD ONC ARIA SESSION SUMMARY
Course Elapsed Days: 6
Plan Fractions Treated to Date: 5
Plan Prescribed Dose Per Fraction: 2.75 Gy
Plan Total Fractions Prescribed: 20
Plan Total Prescribed Dose: 55 Gy
Reference Point Dosage Given to Date: 13.75 Gy
Reference Point Session Dosage Given: 2.75 Gy
Session Number: 5

## 2022-12-15 ENCOUNTER — Ambulatory Visit
Admission: RE | Admit: 2022-12-15 | Discharge: 2022-12-15 | Disposition: A | Discharge status: Home / Self Care | Payer: Medicare Other | Source: Ambulatory Visit | Attending: Radiation Oncology | Admitting: Radiation Oncology

## 2022-12-15 ENCOUNTER — Other Ambulatory Visit: Payer: Self-pay

## 2022-12-15 DIAGNOSIS — Z510 Encounter for antineoplastic radiation therapy: Secondary | ICD-10-CM | POA: Diagnosis not present

## 2022-12-15 DIAGNOSIS — Z191 Hormone sensitive malignancy status: Secondary | ICD-10-CM | POA: Diagnosis not present

## 2022-12-15 LAB — RAD ONC ARIA SESSION SUMMARY
Course Elapsed Days: 7
Plan Fractions Treated to Date: 6
Plan Prescribed Dose Per Fraction: 2.75 Gy
Plan Total Fractions Prescribed: 20
Plan Total Prescribed Dose: 55 Gy
Reference Point Dosage Given to Date: 16.5 Gy
Reference Point Session Dosage Given: 2.75 Gy
Session Number: 6

## 2022-12-16 ENCOUNTER — Ambulatory Visit
Admission: RE | Admit: 2022-12-16 | Discharge: 2022-12-16 | Disposition: A | Discharge status: Home / Self Care | Payer: Medicare Other | Source: Ambulatory Visit | Attending: Radiation Oncology | Admitting: Radiation Oncology

## 2022-12-16 ENCOUNTER — Other Ambulatory Visit: Payer: Self-pay

## 2022-12-16 DIAGNOSIS — Z191 Hormone sensitive malignancy status: Secondary | ICD-10-CM | POA: Diagnosis not present

## 2022-12-16 DIAGNOSIS — Z510 Encounter for antineoplastic radiation therapy: Secondary | ICD-10-CM | POA: Diagnosis not present

## 2022-12-16 LAB — RAD ONC ARIA SESSION SUMMARY
Course Elapsed Days: 8
Plan Fractions Treated to Date: 7
Plan Prescribed Dose Per Fraction: 2.75 Gy
Plan Total Fractions Prescribed: 20
Plan Total Prescribed Dose: 55 Gy
Reference Point Dosage Given to Date: 19.25 Gy
Reference Point Session Dosage Given: 2.75 Gy
Session Number: 7

## 2022-12-17 ENCOUNTER — Ambulatory Visit
Admission: RE | Admit: 2022-12-17 | Discharge: 2022-12-17 | Disposition: A | Discharge status: Home / Self Care | Payer: Medicare Other | Source: Ambulatory Visit | Attending: Radiation Oncology | Admitting: Radiation Oncology

## 2022-12-17 ENCOUNTER — Other Ambulatory Visit: Payer: Self-pay

## 2022-12-17 DIAGNOSIS — Z510 Encounter for antineoplastic radiation therapy: Secondary | ICD-10-CM | POA: Diagnosis not present

## 2022-12-17 DIAGNOSIS — Z191 Hormone sensitive malignancy status: Secondary | ICD-10-CM | POA: Diagnosis not present

## 2022-12-17 LAB — RAD ONC ARIA SESSION SUMMARY
Course Elapsed Days: 9
Plan Fractions Treated to Date: 8
Plan Prescribed Dose Per Fraction: 2.75 Gy
Plan Total Fractions Prescribed: 20
Plan Total Prescribed Dose: 55 Gy
Reference Point Dosage Given to Date: 22 Gy
Reference Point Session Dosage Given: 2.75 Gy
Session Number: 8

## 2022-12-18 ENCOUNTER — Ambulatory Visit
Admission: RE | Admit: 2022-12-18 | Discharge: 2022-12-18 | Disposition: A | Discharge status: Home / Self Care | Payer: Medicare Other | Source: Ambulatory Visit | Attending: Radiation Oncology | Admitting: Radiation Oncology

## 2022-12-18 ENCOUNTER — Ambulatory Visit: Payer: Medicare Other

## 2022-12-18 ENCOUNTER — Other Ambulatory Visit: Payer: Self-pay

## 2022-12-18 DIAGNOSIS — Z510 Encounter for antineoplastic radiation therapy: Secondary | ICD-10-CM | POA: Diagnosis not present

## 2022-12-18 DIAGNOSIS — Z191 Hormone sensitive malignancy status: Secondary | ICD-10-CM | POA: Diagnosis not present

## 2022-12-18 LAB — RAD ONC ARIA SESSION SUMMARY
Course Elapsed Days: 10
Plan Fractions Treated to Date: 9
Plan Prescribed Dose Per Fraction: 2.75 Gy
Plan Total Fractions Prescribed: 20
Plan Total Prescribed Dose: 55 Gy
Reference Point Dosage Given to Date: 24.75 Gy
Reference Point Session Dosage Given: 2.75 Gy
Session Number: 9

## 2022-12-21 ENCOUNTER — Other Ambulatory Visit: Payer: Self-pay

## 2022-12-21 ENCOUNTER — Ambulatory Visit
Admission: RE | Admit: 2022-12-21 | Discharge: 2022-12-21 | Disposition: A | Discharge status: Home / Self Care | Payer: Medicare Other | Source: Ambulatory Visit | Attending: Radiation Oncology | Admitting: Radiation Oncology

## 2022-12-21 DIAGNOSIS — Z191 Hormone sensitive malignancy status: Secondary | ICD-10-CM | POA: Diagnosis not present

## 2022-12-21 DIAGNOSIS — Z510 Encounter for antineoplastic radiation therapy: Secondary | ICD-10-CM | POA: Diagnosis not present

## 2022-12-21 LAB — RAD ONC ARIA SESSION SUMMARY
Course Elapsed Days: 13
Plan Fractions Treated to Date: 10
Plan Prescribed Dose Per Fraction: 2.75 Gy
Plan Total Fractions Prescribed: 20
Plan Total Prescribed Dose: 55 Gy
Reference Point Dosage Given to Date: 27.5 Gy
Reference Point Session Dosage Given: 2.75 Gy
Session Number: 10

## 2022-12-22 ENCOUNTER — Ambulatory Visit
Admission: RE | Admit: 2022-12-22 | Discharge: 2022-12-22 | Disposition: A | Discharge status: Home / Self Care | Payer: Medicare Other | Source: Ambulatory Visit | Attending: Radiation Oncology | Admitting: Radiation Oncology

## 2022-12-22 ENCOUNTER — Other Ambulatory Visit: Payer: Self-pay

## 2022-12-22 DIAGNOSIS — Z510 Encounter for antineoplastic radiation therapy: Secondary | ICD-10-CM | POA: Diagnosis not present

## 2022-12-22 DIAGNOSIS — Z191 Hormone sensitive malignancy status: Secondary | ICD-10-CM | POA: Diagnosis not present

## 2022-12-22 LAB — RAD ONC ARIA SESSION SUMMARY
Course Elapsed Days: 14
Plan Fractions Treated to Date: 11
Plan Prescribed Dose Per Fraction: 2.75 Gy
Plan Total Fractions Prescribed: 20
Plan Total Prescribed Dose: 55 Gy
Reference Point Dosage Given to Date: 30.25 Gy
Reference Point Session Dosage Given: 2.75 Gy
Session Number: 11

## 2022-12-23 ENCOUNTER — Other Ambulatory Visit: Payer: Self-pay

## 2022-12-23 ENCOUNTER — Ambulatory Visit
Admission: RE | Admit: 2022-12-23 | Discharge: 2022-12-23 | Disposition: A | Discharge status: Home / Self Care | Payer: Medicare Other | Source: Ambulatory Visit | Attending: Radiation Oncology | Admitting: Radiation Oncology

## 2022-12-23 DIAGNOSIS — Z191 Hormone sensitive malignancy status: Secondary | ICD-10-CM | POA: Diagnosis not present

## 2022-12-23 DIAGNOSIS — Z510 Encounter for antineoplastic radiation therapy: Secondary | ICD-10-CM | POA: Diagnosis not present

## 2022-12-23 LAB — RAD ONC ARIA SESSION SUMMARY
Course Elapsed Days: 15
Plan Fractions Treated to Date: 12
Plan Prescribed Dose Per Fraction: 2.75 Gy
Plan Total Fractions Prescribed: 20
Plan Total Prescribed Dose: 55 Gy
Reference Point Dosage Given to Date: 33 Gy
Reference Point Session Dosage Given: 2.75 Gy
Session Number: 12

## 2022-12-24 ENCOUNTER — Ambulatory Visit
Admission: RE | Admit: 2022-12-24 | Discharge: 2022-12-24 | Disposition: A | Discharge status: Home / Self Care | Payer: Medicare Other | Source: Ambulatory Visit | Attending: Radiation Oncology | Admitting: Radiation Oncology

## 2022-12-24 ENCOUNTER — Other Ambulatory Visit: Payer: Self-pay

## 2022-12-24 DIAGNOSIS — Z510 Encounter for antineoplastic radiation therapy: Secondary | ICD-10-CM | POA: Diagnosis not present

## 2022-12-24 DIAGNOSIS — Z191 Hormone sensitive malignancy status: Secondary | ICD-10-CM | POA: Diagnosis not present

## 2022-12-24 LAB — RAD ONC ARIA SESSION SUMMARY
Course Elapsed Days: 16
Plan Fractions Treated to Date: 13
Plan Prescribed Dose Per Fraction: 2.75 Gy
Plan Total Fractions Prescribed: 20
Plan Total Prescribed Dose: 55 Gy
Reference Point Dosage Given to Date: 35.75 Gy
Reference Point Session Dosage Given: 2.75 Gy
Session Number: 13

## 2022-12-25 ENCOUNTER — Ambulatory Visit: Payer: Medicare Other

## 2022-12-25 ENCOUNTER — Other Ambulatory Visit: Payer: Self-pay

## 2022-12-25 ENCOUNTER — Ambulatory Visit
Admission: RE | Admit: 2022-12-25 | Discharge: 2022-12-25 | Disposition: A | Discharge status: Home / Self Care | Payer: Medicare Other | Source: Ambulatory Visit | Attending: Radiation Oncology | Admitting: Radiation Oncology

## 2022-12-25 DIAGNOSIS — Z510 Encounter for antineoplastic radiation therapy: Secondary | ICD-10-CM | POA: Insufficient documentation

## 2022-12-25 DIAGNOSIS — Z191 Hormone sensitive malignancy status: Secondary | ICD-10-CM | POA: Diagnosis not present

## 2022-12-25 DIAGNOSIS — C61 Malignant neoplasm of prostate: Secondary | ICD-10-CM | POA: Insufficient documentation

## 2022-12-25 LAB — RAD ONC ARIA SESSION SUMMARY
Course Elapsed Days: 17
Plan Fractions Treated to Date: 14
Plan Prescribed Dose Per Fraction: 2.75 Gy
Plan Total Fractions Prescribed: 20
Plan Total Prescribed Dose: 55 Gy
Reference Point Dosage Given to Date: 38.5 Gy
Reference Point Session Dosage Given: 2.75 Gy
Session Number: 14

## 2022-12-28 ENCOUNTER — Other Ambulatory Visit: Payer: Self-pay

## 2022-12-28 ENCOUNTER — Ambulatory Visit
Admission: RE | Admit: 2022-12-28 | Discharge: 2022-12-28 | Disposition: A | Discharge status: Home / Self Care | Payer: Medicare Other | Source: Ambulatory Visit | Attending: Radiation Oncology | Admitting: Radiation Oncology

## 2022-12-28 DIAGNOSIS — Z510 Encounter for antineoplastic radiation therapy: Secondary | ICD-10-CM | POA: Diagnosis not present

## 2022-12-28 DIAGNOSIS — Z191 Hormone sensitive malignancy status: Secondary | ICD-10-CM | POA: Diagnosis not present

## 2022-12-28 LAB — RAD ONC ARIA SESSION SUMMARY
Course Elapsed Days: 20
Plan Fractions Treated to Date: 15
Plan Prescribed Dose Per Fraction: 2.75 Gy
Plan Total Fractions Prescribed: 20
Plan Total Prescribed Dose: 55 Gy
Reference Point Dosage Given to Date: 41.25 Gy
Reference Point Session Dosage Given: 2.75 Gy
Session Number: 15

## 2022-12-29 ENCOUNTER — Ambulatory Visit
Admission: RE | Admit: 2022-12-29 | Discharge: 2022-12-29 | Disposition: A | Discharge status: Home / Self Care | Payer: Medicare Other | Source: Ambulatory Visit | Attending: Radiation Oncology | Admitting: Radiation Oncology

## 2022-12-29 ENCOUNTER — Other Ambulatory Visit: Payer: Self-pay

## 2022-12-29 DIAGNOSIS — Z510 Encounter for antineoplastic radiation therapy: Secondary | ICD-10-CM | POA: Diagnosis not present

## 2022-12-29 DIAGNOSIS — Z191 Hormone sensitive malignancy status: Secondary | ICD-10-CM | POA: Diagnosis not present

## 2022-12-29 LAB — RAD ONC ARIA SESSION SUMMARY
Course Elapsed Days: 21
Plan Fractions Treated to Date: 16
Plan Prescribed Dose Per Fraction: 2.75 Gy
Plan Total Fractions Prescribed: 20
Plan Total Prescribed Dose: 55 Gy
Reference Point Dosage Given to Date: 44 Gy
Reference Point Session Dosage Given: 2.75 Gy
Session Number: 16

## 2022-12-30 ENCOUNTER — Ambulatory Visit
Admission: RE | Admit: 2022-12-30 | Discharge: 2022-12-30 | Disposition: A | Discharge status: Home / Self Care | Payer: Medicare Other | Source: Ambulatory Visit | Attending: Radiation Oncology | Admitting: Radiation Oncology

## 2022-12-30 ENCOUNTER — Other Ambulatory Visit: Payer: Self-pay

## 2022-12-30 DIAGNOSIS — Z191 Hormone sensitive malignancy status: Secondary | ICD-10-CM | POA: Diagnosis not present

## 2022-12-30 DIAGNOSIS — Z510 Encounter for antineoplastic radiation therapy: Secondary | ICD-10-CM | POA: Diagnosis not present

## 2022-12-30 LAB — RAD ONC ARIA SESSION SUMMARY
Course Elapsed Days: 22
Plan Fractions Treated to Date: 17
Plan Prescribed Dose Per Fraction: 2.75 Gy
Plan Total Fractions Prescribed: 20
Plan Total Prescribed Dose: 55 Gy
Reference Point Dosage Given to Date: 46.75 Gy
Reference Point Session Dosage Given: 2.75 Gy
Session Number: 17

## 2022-12-31 ENCOUNTER — Ambulatory Visit
Admission: RE | Admit: 2022-12-31 | Discharge: 2022-12-31 | Disposition: A | Discharge status: Home / Self Care | Payer: Medicare Other | Source: Ambulatory Visit | Attending: Radiation Oncology | Admitting: Radiation Oncology

## 2022-12-31 ENCOUNTER — Other Ambulatory Visit: Payer: Self-pay

## 2022-12-31 DIAGNOSIS — Z191 Hormone sensitive malignancy status: Secondary | ICD-10-CM | POA: Diagnosis not present

## 2022-12-31 DIAGNOSIS — Z510 Encounter for antineoplastic radiation therapy: Secondary | ICD-10-CM | POA: Diagnosis not present

## 2022-12-31 LAB — RAD ONC ARIA SESSION SUMMARY
Course Elapsed Days: 23
Plan Fractions Treated to Date: 18
Plan Prescribed Dose Per Fraction: 2.75 Gy
Plan Total Fractions Prescribed: 20
Plan Total Prescribed Dose: 55 Gy
Reference Point Dosage Given to Date: 49.5 Gy
Reference Point Session Dosage Given: 2.75 Gy
Session Number: 18

## 2023-01-01 ENCOUNTER — Other Ambulatory Visit: Payer: Self-pay

## 2023-01-01 ENCOUNTER — Ambulatory Visit
Admission: RE | Admit: 2023-01-01 | Discharge: 2023-01-01 | Disposition: A | Discharge status: Home / Self Care | Payer: Medicare Other | Source: Ambulatory Visit | Attending: Radiation Oncology | Admitting: Radiation Oncology

## 2023-01-01 DIAGNOSIS — Z191 Hormone sensitive malignancy status: Secondary | ICD-10-CM | POA: Diagnosis not present

## 2023-01-01 DIAGNOSIS — Z510 Encounter for antineoplastic radiation therapy: Secondary | ICD-10-CM | POA: Diagnosis not present

## 2023-01-01 LAB — RAD ONC ARIA SESSION SUMMARY
Course Elapsed Days: 24
Plan Fractions Treated to Date: 19
Plan Prescribed Dose Per Fraction: 2.75 Gy
Plan Total Fractions Prescribed: 20
Plan Total Prescribed Dose: 55 Gy
Reference Point Dosage Given to Date: 52.25 Gy
Reference Point Session Dosage Given: 2.75 Gy
Session Number: 19

## 2023-01-04 ENCOUNTER — Other Ambulatory Visit: Payer: Self-pay

## 2023-01-04 ENCOUNTER — Encounter: Payer: Self-pay | Admitting: Urology

## 2023-01-04 ENCOUNTER — Ambulatory Visit
Admission: RE | Admit: 2023-01-04 | Discharge: 2023-01-04 | Disposition: A | Discharge status: Home / Self Care | Payer: Medicare Other | Source: Ambulatory Visit | Attending: Radiation Oncology | Admitting: Radiation Oncology

## 2023-01-04 DIAGNOSIS — Z510 Encounter for antineoplastic radiation therapy: Secondary | ICD-10-CM | POA: Diagnosis not present

## 2023-01-04 DIAGNOSIS — Z191 Hormone sensitive malignancy status: Secondary | ICD-10-CM | POA: Diagnosis not present

## 2023-01-04 LAB — RAD ONC ARIA SESSION SUMMARY
Course Elapsed Days: 27
Plan Fractions Treated to Date: 20
Plan Prescribed Dose Per Fraction: 2.75 Gy
Plan Total Fractions Prescribed: 20
Plan Total Prescribed Dose: 55 Gy
Reference Point Dosage Given to Date: 55 Gy
Reference Point Session Dosage Given: 2.75 Gy
Session Number: 20

## 2023-01-12 DIAGNOSIS — M79645 Pain in left finger(s): Secondary | ICD-10-CM | POA: Diagnosis not present

## 2023-01-12 DIAGNOSIS — M79675 Pain in left toe(s): Secondary | ICD-10-CM | POA: Diagnosis not present

## 2023-01-14 DIAGNOSIS — Z0000 Encounter for general adult medical examination without abnormal findings: Secondary | ICD-10-CM | POA: Diagnosis not present

## 2023-01-14 DIAGNOSIS — H353210 Exudative age-related macular degeneration, right eye, stage unspecified: Secondary | ICD-10-CM | POA: Diagnosis not present

## 2023-01-14 DIAGNOSIS — R339 Retention of urine, unspecified: Secondary | ICD-10-CM | POA: Diagnosis not present

## 2023-01-14 DIAGNOSIS — G629 Polyneuropathy, unspecified: Secondary | ICD-10-CM | POA: Diagnosis not present

## 2023-01-14 DIAGNOSIS — E785 Hyperlipidemia, unspecified: Secondary | ICD-10-CM | POA: Diagnosis not present

## 2023-01-14 DIAGNOSIS — I700 Atherosclerosis of aorta: Secondary | ICD-10-CM | POA: Diagnosis not present

## 2023-01-25 DIAGNOSIS — R8271 Bacteriuria: Secondary | ICD-10-CM | POA: Diagnosis not present

## 2023-01-25 DIAGNOSIS — R338 Other retention of urine: Secondary | ICD-10-CM | POA: Diagnosis not present

## 2023-01-26 ENCOUNTER — Telehealth: Payer: Self-pay

## 2023-01-26 NOTE — Telephone Encounter (Signed)
RN return call to Teylor Manders (son) who had concerns about Mr. Weisensel unable to urinate and frequent stools x 1 week.  Loleta Dicker said that he took his father to the Urologist office on 34/10/2022 that they checked bladder took off 400 cc urine and ran test for UTI and urine culture.  Mr. Fobbs is now on a preventive antibiotic while waiting on urine culture results and is self cath as needed per son.  Mr. Raffetto was also diagnosed with prostatitis.  RN advised to use Imodium AD can use up to 8 tablets/day until stool is a more solid consistency.  RN advised to use preparation H for and rectal irritation.  RN also advised try BRAT diet and drink Gatorade to help replenish electrolyte loss along with water.  Advised if any symptoms should worsen or develop new symptoms (fever, fatigue, or increased weakness) to go directly to emergency room.  Loleta Dicker (son) was appreciative of the advise and call was ended.

## 2023-01-27 ENCOUNTER — Emergency Department (HOSPITAL_COMMUNITY): Payer: Medicare Other

## 2023-01-27 ENCOUNTER — Other Ambulatory Visit: Payer: Self-pay

## 2023-01-27 ENCOUNTER — Inpatient Hospital Stay (HOSPITAL_COMMUNITY)
Admission: EM | Admit: 2023-01-27 | Discharge: 2023-02-01 | DRG: 699 | Disposition: A | Discharge status: Home / Self Care | Payer: Medicare Other | Attending: Internal Medicine | Admitting: Internal Medicine

## 2023-01-27 ENCOUNTER — Encounter (HOSPITAL_COMMUNITY): Payer: Self-pay

## 2023-01-27 DIAGNOSIS — C772 Secondary and unspecified malignant neoplasm of intra-abdominal lymph nodes: Secondary | ICD-10-CM | POA: Diagnosis not present

## 2023-01-27 DIAGNOSIS — R109 Unspecified abdominal pain: Secondary | ICD-10-CM | POA: Diagnosis not present

## 2023-01-27 DIAGNOSIS — N390 Urinary tract infection, site not specified: Secondary | ICD-10-CM | POA: Diagnosis present

## 2023-01-27 DIAGNOSIS — N179 Acute kidney failure, unspecified: Secondary | ICD-10-CM | POA: Diagnosis present

## 2023-01-27 DIAGNOSIS — Z9079 Acquired absence of other genital organ(s): Secondary | ICD-10-CM

## 2023-01-27 DIAGNOSIS — N3000 Acute cystitis without hematuria: Secondary | ICD-10-CM | POA: Diagnosis not present

## 2023-01-27 DIAGNOSIS — Z96611 Presence of right artificial shoulder joint: Secondary | ICD-10-CM | POA: Diagnosis present

## 2023-01-27 DIAGNOSIS — C61 Malignant neoplasm of prostate: Secondary | ICD-10-CM | POA: Diagnosis present

## 2023-01-27 DIAGNOSIS — R338 Other retention of urine: Secondary | ICD-10-CM | POA: Diagnosis not present

## 2023-01-27 DIAGNOSIS — E785 Hyperlipidemia, unspecified: Secondary | ICD-10-CM | POA: Diagnosis not present

## 2023-01-27 DIAGNOSIS — T83518A Infection and inflammatory reaction due to other urinary catheter, initial encounter: Principal | ICD-10-CM | POA: Diagnosis present

## 2023-01-27 DIAGNOSIS — R339 Retention of urine, unspecified: Secondary | ICD-10-CM | POA: Diagnosis present

## 2023-01-27 DIAGNOSIS — I700 Atherosclerosis of aorta: Secondary | ICD-10-CM | POA: Diagnosis not present

## 2023-01-27 DIAGNOSIS — Z923 Personal history of irradiation: Secondary | ICD-10-CM

## 2023-01-27 DIAGNOSIS — K529 Noninfective gastroenteritis and colitis, unspecified: Secondary | ICD-10-CM | POA: Diagnosis not present

## 2023-01-27 DIAGNOSIS — N401 Enlarged prostate with lower urinary tract symptoms: Secondary | ICD-10-CM | POA: Diagnosis present

## 2023-01-27 DIAGNOSIS — Z96653 Presence of artificial knee joint, bilateral: Secondary | ICD-10-CM | POA: Diagnosis present

## 2023-01-27 DIAGNOSIS — R197 Diarrhea, unspecified: Secondary | ICD-10-CM | POA: Diagnosis present

## 2023-01-27 DIAGNOSIS — B9620 Unspecified Escherichia coli [E. coli] as the cause of diseases classified elsewhere: Secondary | ICD-10-CM | POA: Diagnosis present

## 2023-01-27 DIAGNOSIS — Z1612 Extended spectrum beta lactamase (ESBL) resistance: Secondary | ICD-10-CM | POA: Diagnosis not present

## 2023-01-27 DIAGNOSIS — Y846 Urinary catheterization as the cause of abnormal reaction of the patient, or of later complication, without mention of misadventure at the time of the procedure: Secondary | ICD-10-CM | POA: Diagnosis present

## 2023-01-27 DIAGNOSIS — Z1629 Resistance to other single specified antibiotic: Secondary | ICD-10-CM | POA: Diagnosis present

## 2023-01-27 LAB — BASIC METABOLIC PANEL
Anion gap: 12 (ref 5–15)
BUN: 19 mg/dL (ref 8–23)
CO2: 22 mmol/L (ref 22–32)
Calcium: 9.2 mg/dL (ref 8.9–10.3)
Chloride: 101 mmol/L (ref 98–111)
Creatinine, Ser: 1.4 mg/dL — ABNORMAL HIGH (ref 0.61–1.24)
GFR, Estimated: 51 mL/min — ABNORMAL LOW (ref >60)
Glucose, Bld: 111 mg/dL — ABNORMAL HIGH (ref 70–99)
Potassium: 4.1 mmol/L (ref 3.5–5.1)
Sodium: 135 mmol/L (ref 135–145)

## 2023-01-27 LAB — URINALYSIS, ROUTINE W REFLEX MICROSCOPIC
Bilirubin Urine: NEGATIVE
Glucose, UA: NEGATIVE mg/dL
Ketones, ur: NEGATIVE mg/dL
Nitrite: POSITIVE — AB
Protein, ur: 100 mg/dL — AB
Specific Gravity, Urine: 1.012 (ref 1.005–1.030)
WBC, UA: >50 WBC/hpf (ref 0–5)
pH: 5 (ref 5.0–8.0)

## 2023-01-27 LAB — CBC
HCT: 29.1 % — ABNORMAL LOW (ref 39.0–52.0)
Hemoglobin: 10.4 g/dL — ABNORMAL LOW (ref 13.0–17.0)
MCH: 33.7 pg (ref 26.0–34.0)
MCHC: 35.7 g/dL (ref 30.0–36.0)
MCV: 94.2 fL (ref 80.0–100.0)
Platelets: 160 10*3/uL (ref 150–400)
RBC: 3.09 MIL/uL — ABNORMAL LOW (ref 4.22–5.81)
RDW: 14.1 % (ref 11.5–15.5)
WBC: 4.3 10*3/uL (ref 4.0–10.5)
nRBC: 0 % (ref 0.0–0.2)

## 2023-01-27 MED ORDER — SODIUM CHLORIDE 0.9 % IV SOLN: INTRAVENOUS | Status: AC

## 2023-01-27 MED ORDER — IOHEXOL 350 MG/ML SOLN
75.0000 mL | Freq: Once | INTRAVENOUS | Status: AC | PRN
  Administered 2023-01-27: 75 mL via INTRAVENOUS

## 2023-01-27 MED ORDER — ENOXAPARIN SODIUM 30 MG/0.3ML IJ SOSY
30.0000 mg | PREFILLED_SYRINGE | INTRAMUSCULAR | Status: DC
  Administered 2023-01-28: 30 mg via SUBCUTANEOUS
  Filled 2023-01-27: qty 0.3

## 2023-01-27 MED ORDER — SODIUM CHLORIDE 0.9 % IV BOLUS
500.0000 mL | Freq: Once | INTRAVENOUS | Status: AC
  Administered 2023-01-27: 500 mL via INTRAVENOUS

## 2023-01-27 MED ORDER — SODIUM CHLORIDE 0.9 % IV SOLN
1.0000 g | Freq: Two times a day (BID) | INTRAVENOUS | Status: DC
  Administered 2023-01-28 – 2023-01-30 (×6): 1 g via INTRAVENOUS
  Filled 2023-01-27 (×8): qty 20

## 2023-01-27 MED ORDER — ACETAMINOPHEN 325 MG PO TABS: 650.0000 mg | ORAL_TABLET | Freq: Four times a day (QID) | ORAL | Status: DC | PRN

## 2023-01-27 MED ORDER — ACETAMINOPHEN 650 MG RE SUPP: 650.0000 mg | Freq: Four times a day (QID) | RECTAL | Status: DC | PRN

## 2023-01-27 MED ORDER — SODIUM CHLORIDE 0.9 % IV SOLN
1.0000 g | Freq: Once | INTRAVENOUS | Status: AC
  Administered 2023-01-27: 1 g via INTRAVENOUS
  Filled 2023-01-27: qty 20
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
## 2023-01-27 NOTE — Progress Notes (Signed)
Pharmacy Antibiotic Note  Michael Burch is a 82 y.o. male admitted on 01/27/2023 with UTI.  H/O ESBL E. Coli 1/24.  Pharmacy has been consulted for meropenem dosing.  Plan: Meropenem 1 g IV q12h    Temp (24hrs), Avg:98 F (36.7 C), Min:97.8 F (36.6 C), Max:98.2 F (36.8 C)  Recent Labs  Lab 01/27/23 1812  WBC 4.3  CREATININE 1.40*    CrCl cannot be calculated (Unknown ideal weight.).    No Known Allergies  Caryl Pina 01/27/2023 11:49 PM
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
## 2023-01-27 NOTE — ED Triage Notes (Signed)
Pt came in via POV d/t Abd pressure from inability to urinate. Pt family in triage with pt reports he needed to self cath last night (does this PRN) since he could not urinate (they emptied 500cc last night) he has not urinated since then & reports pressure in his abd with palpation & no pain when sitting still. Pt also reports decreased appetite, constant diarrhea & has been on ABTs lately for a UTI Tx, last saw his Urologist 1 week ago. Pt was told he also has a bladder infection (per family). Hx of Prostate CA & radiation, mucus from his rectum d/t this. A/Ox4.

## 2023-01-27 NOTE — ED Provider Notes (Signed)
[facility] Provider Note   CSN: [id number] Arrival date & time: 01/27/23  1733     History  Chief Complaint  Patient presents with   Urinary Retention   Abdominal Pain   UTI    Michael Burch is a 82 y.o. male.  HPI   82 year old male with past medical history of prostate cancer and previous urinary retention requiring a Foley presents emergency department with concern for inability to urinate.  Patient is non-English speaking, he has wife and family at bedside, declines medical interpreter.  Patient noticed this starting yesterday, he had to self cath last night because he was unable to urinate, relieved around 500 of urine.  Since then he has been unable to urinate so he comes in for evaluation of suprapubic pain.  Also they were called by the urology office, [facility] where he sees Dr. Jeffie Burch that his urine culture done on Friday is pan resistant to oral antibiotics and then he needs IV antibiotics.  Patient also is having ongoing diarrhea and intermittent mucus discharge from the rectum while ongoing radiation for prostate cancer.  No fever or vomiting.  Home Medications Prior to Admission medications   Medication Sig Start Date End Date Taking? Authorizing Provider  Calcium Carb-Cholecalciferol (CALCIUM 600 + D PO) Take 1 tablet by mouth 2 (two) times daily.    [provider]  darolutamide (NUBEQA) 300 MG tablet Take 600 mg by mouth 2 (two) times daily. 09/09/22   [provider]  nitrofurantoin, macrocrystal-monohydrate, (MACROBID) 100 MG capsule Take 100 mg by mouth 2 (two) times daily. 11/09/22   [provider]      Allergies    Patient has no known allergies.    Review of Systems   Review of Systems  Constitutional:  Positive for chills and fatigue. Negative for fever.  Gastrointestinal:  Positive for diarrhea. Negative for nausea.  Genitourinary:  Positive for difficulty urinating.  Negative for flank pain, hematuria, penile swelling and scrotal swelling.    Physical Exam Updated Vital Signs BP 131/75   Pulse 70   Temp 97.8 F (36.6 C) (Oral)   Resp 19   SpO2 99%  Physical Exam Vitals and nursing note reviewed.  Constitutional:      General: He is not in acute distress.    Appearance: Normal appearance.  HENT:     Head: Normocephalic.     Mouth/Throat:     Mouth: Mucous membranes are moist.  Cardiovascular:     Rate and Rhythm: Normal rate.  Pulmonary:     Effort: Pulmonary effort is normal. No respiratory distress.  Abdominal:     General: Bowel sounds are normal.     Palpations: Abdomen is soft.     Tenderness: There is abdominal tenderness in the suprapubic area.  Skin:    General: Skin is warm.  Neurological:     Mental Status: He is alert and oriented to person, place, and time. Mental status is at baseline.  Psychiatric:        Mood and Affect: Mood normal.     ED Results / Procedures / Treatments   Labs (all labs ordered are listed, but only abnormal results are displayed) Labs Reviewed  URINALYSIS, ROUTINE W REFLEX MICROSCOPIC - Abnormal; Notable for the following components:      Result Value   Color, Urine AMBER (*)    APPearance CLOUDY (*)    Hgb urine dipstick MODERATE (*)    Protein,  ur 100 (*)    Nitrite POSITIVE (*)    Leukocytes,Ua LARGE (*)    Bacteria, UA MANY (*)    All other components within normal limits  CBC - Abnormal; Notable for the following components:   RBC 3.09 (*)    Hemoglobin 10.4 (*)    HCT 29.1 (*)    All other components within normal limits  BASIC METABOLIC PANEL - Abnormal; Notable for the following components:   Glucose, Bld 111 (*)    Creatinine, Ser 1.40 (*)    GFR, Estimated 51 (*)    All other components within normal limits  URINE CULTURE    EKG None  Radiology No results found.  Procedures Procedures    Medications Ordered in ED Medications  sodium chloride 0.9 % bolus 500 mL  ( Intravenous Restarted 01/27/23 2149)  iohexol (OMNIPAQUE) 350 MG/ML injection 75 mL (75 mLs Intravenous Contrast Given 01/27/23 2207)    ED Course/ Medical Decision Making/ A&P                             Medical Decision Making Amount and/or Complexity of Data Reviewed Labs: ordered. Radiology: ordered.  Risk Prescription drug management. Decision regarding hospitalization.   82 year old male presents emergency department concern for urinary retention.  Had to self cath last night, unable to pass urine.  Also complaining of fatigue, chills.  Recently put on oral Macrobid for presumed urinary tract infection, culture from Friday is reportedly pan resistant to oral antibiotics.  This was reported by family who received a phone call from the urology office.  Patient is afebrile.  Blood work is reassuring with no leukocytosis, there is a mild elevation of the creatinine from baseline.  Urinalysis shows UTI.  Bladder scan showed over 500 cc of urine so Foley was placed.  This drained yellow urine freely.  Patient had immediate relief.  In regards to the diarrhea/intermittent rectal discharge CT of the abdomen pelvis was done.  This showed chronic findings and bladder inflammation but no other acute findings.  Consulted with [facility], they were able to verbally review the urine culture results.  It is pan resistant to any oral antibiotics, main sensitivities to meropenem which has been ordered by pharmacy.  Patient will require admission for IV antibiotics.  No signs of acute sepsis at this time.  IV fluids have been running in regards to AKI and Foley continues to drain clear urine.  Patients evaluation and results requires admission for further treatment and care.  Spoke with hospitalist, reviewed patient's ED course and they accept admission.  Patient agrees with admission plan, offers no new complaints and is stable/unchanged at time of admit.        Final Clinical  Impression(s) / ED Diagnoses Final diagnoses:  None    Rx / DC Orders ED Discharge Orders     None         Lorelle Gibbs, DO 01/27/23 2327

## 2023-01-28 DIAGNOSIS — N390 Urinary tract infection, site not specified: Secondary | ICD-10-CM | POA: Diagnosis not present

## 2023-01-28 LAB — GASTROINTESTINAL PANEL BY PCR, STOOL (REPLACES STOOL CULTURE)

## 2023-01-28 LAB — BASIC METABOLIC PANEL
Anion gap: 4 — ABNORMAL LOW (ref 5–15)
BUN: 16 mg/dL (ref 8–23)
CO2: 24 mmol/L (ref 22–32)
Calcium: 8.4 mg/dL — ABNORMAL LOW (ref 8.9–10.3)
Chloride: 107 mmol/L (ref 98–111)
Creatinine, Ser: 1.11 mg/dL (ref 0.61–1.24)
GFR, Estimated: >60 mL/min (ref >60)
Glucose, Bld: 108 mg/dL — ABNORMAL HIGH (ref 70–99)
Potassium: 3.8 mmol/L (ref 3.5–5.1)
Sodium: 135 mmol/L (ref 135–145)

## 2023-01-28 LAB — CBC
HCT: 28.7 % — ABNORMAL LOW (ref 39.0–52.0)
Hemoglobin: 10.1 g/dL — ABNORMAL LOW (ref 13.0–17.0)
MCH: 32.8 pg (ref 26.0–34.0)
MCHC: 35.2 g/dL (ref 30.0–36.0)
MCV: 93.2 fL (ref 80.0–100.0)
Platelets: 141 10*3/uL — ABNORMAL LOW (ref 150–400)
RBC: 3.08 MIL/uL — ABNORMAL LOW (ref 4.22–5.81)
RDW: 14.1 % (ref 11.5–15.5)
WBC: 4.3 10*3/uL (ref 4.0–10.5)
nRBC: 0 % (ref 0.0–0.2)

## 2023-01-28 MED ORDER — IPRATROPIUM-ALBUTEROL 0.5-2.5 (3) MG/3ML IN SOLN: 3.0000 mL | RESPIRATORY_TRACT | Status: DC | PRN

## 2023-01-28 MED ORDER — OYSTER SHELL CALCIUM/D3 500-5 MG-MCG PO TABS
1.0000 | ORAL_TABLET | Freq: Two times a day (BID) | ORAL | Status: DC
  Administered 2023-01-28 – 2023-02-01 (×8): 1 via ORAL
  Filled 2023-01-28 (×8): qty 1

## 2023-01-28 MED ORDER — METOPROLOL TARTRATE 5 MG/5ML IV SOLN
5.0000 mg | INTRAVENOUS | Status: DC | PRN
  Filled 2023-01-28: qty 5

## 2023-01-28 MED ORDER — TRAZODONE HCL 50 MG PO TABS: 50.0000 mg | ORAL_TABLET | Freq: Every evening | ORAL | Status: DC | PRN

## 2023-01-28 MED ORDER — ONDANSETRON HCL 4 MG/2ML IJ SOLN: 4.0000 mg | Freq: Four times a day (QID) | INTRAMUSCULAR | Status: DC | PRN

## 2023-01-28 MED ORDER — DAROLUTAMIDE 300 MG PO TABS
600.0000 mg | ORAL_TABLET | Freq: Two times a day (BID) | ORAL | Status: DC
  Administered 2023-01-28 – 2023-02-01 (×9): 600 mg via ORAL
  Filled 2023-01-28 (×10): qty 2

## 2023-01-28 MED ORDER — HYDRALAZINE HCL 20 MG/ML IJ SOLN: 10.0000 mg | INTRAMUSCULAR | Status: DC | PRN

## 2023-01-28 NOTE — Progress Notes (Signed)
PROGRESS NOTE    Michael Burch  [id number] DOB: 10-07-1941 DOA: 01/27/2023 PCP: [name], MD   Brief Narrative:  82 y.o. male with medical history significant for prostate cancer with metastasis who completed radiation 1 month ago and now presents with fevers, diarrhea, suprapubic pain, and urinary retention. He was recently started on Macrodantin for UTI and started doing self cath as needed a few days ago. CT of the abdomen and pelvis demonstrates markedly irregular urinary bladder wall thickening with mucosal hyperenhancement and perivesicular fat stranding.    Assessment & Plan:  Principal Problem:   Complicated UTI (urinary tract infection) Active Problems:   Acute urinary retention   AKI (acute kidney injury)   Diarrhea    UTI, catheter associated urinary tract infection.  Prior to arrival Acute urinary retention Likely secondary to frequent self cath.  Previous history of ESBL in January 2024.  CT scan does not show any evidence of pyelonephritis, clinically does not have any CVA tenderness either.  We will treat this as acute cystitis with IV meropenem according to previous cultures.  Cultures have been sent here but we will have to wait and see if it grows anything.  Patient was already on antibiotics prior to these cultures. -Due to urinary retention, Foley catheter placed.  We can allow bladder decompression and eventually attempt voiding trial -Monitor fever curve.  No evidence of leukocytosis  AKI  Admission Cr 1.40 > 1.11. Improved with Fluids Baseline 1.1   Diarrhea   Nonspecific, GI panel pending   Metastatic prostate cancer with involvement of retroperitoneal/pelvic lymph nodes - Followed by [facility] and [facility], tolerating darolutamide and leuprolide, completed radiation one month ago    DVT prophylaxis: Lovenox Code Status: Full Family Communication: Family at bedside Status is: Inpatient Continue hospital stay for IV antibiotics  while we are monitoring culture data       Diet Orders (From admission, onward)     Start     Ordered   01/27/23 2325  Diet regular Room service appropriate? Yes; Fluid consistency: Thin  Diet effective now       Question Answer Comment  Room service appropriate? Yes   Fluid consistency: Thin      01/27/23 2325            Subjective: Patient seen and examined at bedside.  Tells me he feels little better this morning.  Denies any diarrhea.  He remains afebrile overnight.  Family is also present at bedside and discussed care with the son over the phone.   Examination:  Constitutional: Not in acute distress Respiratory: Clear to auscultation bilaterally Cardiovascular: Normal sinus rhythm, no rubs Abdomen: Nontender nondistended good bowel sounds Musculoskeletal: No edema noted Skin: No rashes seen Neurologic: CN 2-12 grossly intact.  And nonfocal Psychiatric: Normal judgment and insight. Alert and oriented x 3. Normal mood.  Foley catheter in place.  Placed on 01/27/2023  Objective: Vitals:   01/28/23 0033 01/28/23 0034 01/28/23 0112 01/28/23 0341  BP:   (!) 150/81 (!) 147/79  Pulse: 73 73 77 80  Resp: 18 19 18 19   Temp:   98 F (36.7 C) 98.6 F (37 C)  TempSrc:   Oral Oral  SpO2: 100% 100% 100% 100%    Intake/Output Summary (Last 24 hours) at 01/28/2023 0818 Last data filed at 01/28/2023 0307 Gross per 24 hour  Intake 240 ml  Output 1150 ml  Net -910 ml   There were no vitals filed for this visit.  Scheduled Meds:  darolutamide  600 mg Oral BID   enoxaparin (LOVENOX) injection  30 mg Subcutaneous Q24H   Continuous Infusions:  sodium chloride 125 mL/hr at 01/28/23 0018   meropenem (MERREM) IV      Nutritional status     There is no height or weight on file to calculate BMI.  Data Reviewed:   CBC: Recent Labs  Lab 01/27/23 1812 01/28/23 0359  WBC 4.3 4.3  HGB 10.4* 10.1*  HCT 29.1* 28.7*  MCV 94.2 93.2  PLT 160 [id number]*   Basic Metabolic  Panel: Recent Labs  Lab 01/27/23 1812 01/28/23 0359  NA 135 135  K 4.1 3.8  CL 101 107  CO2 22 24  GLUCOSE 111* 108*  BUN 19 16  CREATININE 1.40* 1.11  CALCIUM 9.2 8.4*   GFR: CrCl cannot be calculated (Unknown ideal weight.). Liver Function Tests: No results for input(s): "AST", "ALT", "ALKPHOS", "BILITOT", "PROT", "ALBUMIN" in the last 168 hours. No results for input(s): "LIPASE", "AMYLASE" in the last 168 hours. No results for input(s): "AMMONIA" in the last 168 hours. Coagulation Profile: No results for input(s): "INR", "PROTIME" in the last 168 hours. Cardiac Enzymes: No results for input(s): "CKTOTAL", "CKMB", "CKMBINDEX", "TROPONINI" in the last 168 hours. BNP (last 3 results) No results for input(s): "PROBNP" in the last 8760 hours. HbA1C: No results for input(s): "HGBA1C" in the last 72 hours. CBG: No results for input(s): "GLUCAP" in the last 168 hours. Lipid Profile: No results for input(s): "CHOL", "HDL", "LDLCALC", "TRIG", "CHOLHDL", "LDLDIRECT" in the last 72 hours. Thyroid Function Tests: No results for input(s): "TSH", "T4TOTAL", "FREET4", "T3FREE", "THYROIDAB" in the last 72 hours. Anemia Panel: No results for input(s): "VITAMINB12", "FOLATE", "FERRITIN", "TIBC", "IRON", "RETICCTPCT" in the last 72 hours. Sepsis Labs: No results for input(s): "PROCALCITON", "LATICACIDVEN" in the last 168 hours.  No results found for this or any previous visit (from the past 240 hour(s)).       Radiology Studies: CT Abdomen Pelvis W Contrast  Result Date: 01/27/2023 CLINICAL DATA:  Abdominal pain, difficulty urinating, decreased appetite, diarrhea, history of prostate cancer EXAM: CT ABDOMEN AND PELVIS WITH CONTRAST TECHNIQUE: Multidetector CT imaging of the abdomen and pelvis was performed using the standard protocol following bolus administration of intravenous contrast. RADIATION DOSE REDUCTION: This exam was performed according to the departmental dose-optimization  program which includes automated exposure control, adjustment of the mA and/or kV according to patient size and/or use of iterative reconstruction technique. CONTRAST:  84mL OMNIPAQUE IOHEXOL 350 MG/ML SOLN COMPARISON:  12/31/2021 FINDINGS: Lower chest: No acute pleural or parenchymal lung disease. Hepatobiliary: Stable hepatic cyst abutting the falciform ligament. Otherwise unremarkable appearance of the liver and gallbladder. No biliary duct dilation. Pancreas: Unremarkable. No pancreatic ductal dilatation or surrounding inflammatory changes. Spleen: Normal in size without focal abnormality. Adrenals/Urinary Tract: Bladder is decompressed with a Foley catheter. There is marked irregular bladder wall thickening, with mucosal hyperenhancement and perivesicular fat stranding. Kidneys are unremarkable. No urinary tract calculi or obstruction. The adrenals are normal. Stomach/Bowel: No bowel obstruction or ileus. Normal appendix right lower quadrant. No bowel wall thickening or inflammatory change. Small hiatal hernia. Vascular/Lymphatic: Aortic atherosclerosis. No enlarged abdominal or pelvic lymph nodes. Interval resolution of the retroperitoneal and iliac adenopathy seen on prior PET scan. Reproductive: Fiduciary markers are seen within the prostate bed. Marked decreased size of the prostate since prior exam. Other: No free fluid or free intraperitoneal gas. Fat containing left inguinal hernia. No bowel herniation. Musculoskeletal: There are no  acute or destructive bony lesions. Chronic L1 compression deformity. Reconstructed images demonstrate no additional findings. IMPRESSION: 1. Marked irregular bladder wall thickening, with mucosal hyperenhancement and perivesicular fat stranding. Findings most consistent with acute cystitis given clinical presentation, though secondary involvement of the bladder due to prostate cancer cannot be completely excluded in light of previous PET CT findings. Correlation with  urinalysis recommended. 2. Marked decreased size of prostate, with interval placement of fiduciary markers. 3. Interval resolution of the pathologic retroperitoneal and iliac adenopathy seen on prior PET scan. 4.  Aortic Atherosclerosis (ICD10-I70.0). Electronically Signed   By: Randa Ngo M.D.   On: 01/27/2023 22:24           LOS: 1 day   Time spent= 35 mins    Alizzon Dioguardi Arsenio Loader, MD [facility]  If 7PM-7AM, please contact night-coverage  01/28/2023, 8:18 AM

## 2023-01-29 DIAGNOSIS — N390 Urinary tract infection, site not specified: Secondary | ICD-10-CM | POA: Diagnosis not present

## 2023-01-29 LAB — COMPREHENSIVE METABOLIC PANEL
ALT: 16 U/L (ref 0–44)
AST: 24 U/L (ref 15–41)
Albumin: 2.8 g/dL — ABNORMAL LOW (ref 3.5–5.0)
Alkaline Phosphatase: 83 U/L (ref 38–126)
Anion gap: 6 (ref 5–15)
BUN: 12 mg/dL (ref 8–23)
CO2: 24 mmol/L (ref 22–32)
Calcium: 9 mg/dL (ref 8.9–10.3)
Chloride: 104 mmol/L (ref 98–111)
Creatinine, Ser: 1.03 mg/dL (ref 0.61–1.24)
GFR, Estimated: >60 mL/min (ref >60)
Glucose, Bld: 104 mg/dL — ABNORMAL HIGH (ref 70–99)
Potassium: 3.9 mmol/L (ref 3.5–5.1)
Sodium: 134 mmol/L — ABNORMAL LOW (ref 135–145)
Total Bilirubin: 0.8 mg/dL (ref 0.3–1.2)
Total Protein: 5.6 g/dL — ABNORMAL LOW (ref 6.5–8.1)

## 2023-01-29 LAB — MAGNESIUM: Magnesium: 2.2 mg/dL (ref 1.7–2.4)

## 2023-01-29 LAB — CBC
HCT: 29.8 % — ABNORMAL LOW (ref 39.0–52.0)
Hemoglobin: 10.3 g/dL — ABNORMAL LOW (ref 13.0–17.0)
MCH: 32.4 pg (ref 26.0–34.0)
MCHC: 34.6 g/dL (ref 30.0–36.0)
MCV: 93.7 fL (ref 80.0–100.0)
Platelets: 149 10*3/uL — ABNORMAL LOW (ref 150–400)
RBC: 3.18 MIL/uL — ABNORMAL LOW (ref 4.22–5.81)
RDW: 13.9 % (ref 11.5–15.5)
WBC: 4.1 10*3/uL (ref 4.0–10.5)
nRBC: 0 % (ref 0.0–0.2)

## 2023-01-29 MED ORDER — CHLORHEXIDINE GLUCONATE CLOTH 2 % EX PADS
6.0000 | MEDICATED_PAD | Freq: Every day | CUTANEOUS | Status: DC
  Administered 2023-01-29 – 2023-02-01 (×4): 6 via TOPICAL

## 2023-01-29 MED ORDER — ENOXAPARIN SODIUM 40 MG/0.4ML IJ SOSY
40.0000 mg | PREFILLED_SYRINGE | INTRAMUSCULAR | Status: DC
  Administered 2023-01-29 – 2023-02-01 (×4): 40 mg via SUBCUTANEOUS
  Filled 2023-01-29 (×4): qty 0.4

## 2023-01-29 NOTE — Progress Notes (Signed)
PROGRESS NOTE    Michael Burch  MSX:[id number] DOB: 1941/02/15 DOA: 01/27/2023 PCP: Blair Heys, MD   Brief Narrative:  82 y.o. male with medical history significant for prostate cancer with metastasis who completed radiation 1 month ago and now presents with fevers, diarrhea, suprapubic pain, and urinary retention. He was recently started on Macrodantin for UTI and started doing self cath as needed a few days ago. CT of the abdomen and pelvis demonstrates markedly irregular urinary bladder wall thickening with mucosal hyperenhancement and perivesicular fat stranding.  Diarrhea resolved prior to hospitalization but GI panel was negative.  Urine cultures are growing GNR.   Assessment & Plan:  Principal Problem:   Complicated UTI (urinary tract infection) Active Problems:   Acute urinary retention   AKI (acute kidney injury)   Diarrhea    UTI, catheter associated urinary tract infection.  Prior to arrival Acute urinary retention Likely secondary to frequent self cath.  Previous history of ESBL in January 2024.  CT scan does not show any evidence of pyelonephritis, clinically does not have any CVA tenderness either.  We will treat this as acute cystitis with IV meropenem according to previous cultures.  Currently Foley catheter in place to help decompression of his bladder, we can attempt voiding trial but likely will end up requiring Foley upon discharge. -Monitor fever curve.  No evidence of leukocytosis  I discussed case with Dr. Alycia Rossetti on 4/4.  Also called Dr. Greggory Stallion his [facility] onc[facility] but he is on vacation.  Discussed case with Dr. Gwynn Burly infectious disease.  Agrees that we should treat patient with meropenem for 5 days total, if clinically improves we can give him fosfomycin on the last day of discharge and have him follow-up with [facility] oncology sooner than later.   AKI  Admission Cr 1.40, now back to baseline   Diarrhea, resolved Nonspecific, GI panel is negative    Metastatic prostate cancer with involvement of retroperitoneal/pelvic lymph nodes - Followed by [facility] and [facility], tolerating darolutamide and leuprolide, completed radiation one month ago    DVT prophylaxis: Lovenox Code Status: Full Family Communication: Discussed care with family Status is: Inpatient Anticipated IV antibiotics until 4/7     Diet Orders (From admission, onward)     Start     Ordered   01/27/23 2325  Diet regular Room service appropriate? Yes; Fluid consistency: Thin  Diet effective now       Question Answer Comment  Room service appropriate? Yes   Fluid consistency: Thin      01/27/23 2325            Subjective:  Seen and examined at bedside, patient tells me he is improving.  Has some burning sensation but overall remains afebrile. Cussed case with patient's family at bedside and over the phone  Examination: Constitutional: Not in acute distress Respiratory: Clear to auscultation bilaterally Cardiovascular: Normal sinus rhythm, no rubs Abdomen: Nontender nondistended good bowel sounds Musculoskeletal: No edema noted Skin: No rashes seen Neurologic: CN 2-12 grossly intact.  And nonfocal Psychiatric: Normal judgment and insight. Alert and oriented x 3. Normal mood.  Foley catheter in place.  Placed on 01/27/2023  Objective: Vitals:   01/28/23 1759 01/28/23 2139 01/29/23 0448 01/29/23 0750  BP: 110/68 119/72 138/81 118/69  Pulse: 77 84 71 65  Resp: 17 16 16 16   Temp: 98.4 F (36.9 C) 99 F (37.2 C) 98.7 F (37.1 C) 99 F (37.2 C)  TempSrc: Oral Oral Oral Oral  SpO2: 100% 100% 100% 99%  Weight:   61.8 kg     Intake/Output Summary (Last 24 hours) at 01/29/2023 0803 Last data filed at 01/29/2023 0301 Gross per 24 hour  Intake 1071.04 ml  Output 1800 ml  Net -728.96 ml   Filed Weights   01/28/23 1100 01/29/23 0448  Weight: 58 kg 61.8 kg    Scheduled Meds:  calcium-vitamin D  1 tablet Oral BID   darolutamide  600 mg  Oral BID   enoxaparin (LOVENOX) injection  30 mg Subcutaneous Q24H   Continuous Infusions:  meropenem (MERREM) IV Stopped (01/28/23 2151)    Nutritional status     Body mass index is 24.13 kg/m.  Data Reviewed:   CBC: Recent Labs  Lab 01/27/23 1812 01/28/23 0359 01/29/23 0243  WBC 4.3 4.3 4.1  HGB 10.4* 10.1* 10.3*  HCT 29.1* 28.7* 29.8*  MCV 94.2 93.2 93.7  PLT 160 141* 149*   Basic Metabolic Panel: Recent Labs  Lab 01/27/23 1812 01/28/23 0359 01/29/23 0243  NA 135 135 134*  K 4.1 3.8 3.9  CL 101 107 104  CO2 22 24 24   GLUCOSE 111* 108* 104*  BUN 19 16 12   CREATININE 1.40* 1.11 1.03  CALCIUM 9.2 8.4* 9.0  MG  --   --  2.2   GFR: Estimated Creatinine Clearance: 46.8 mL/min (by C-G formula based on SCr of 1.03 mg/dL). Liver Function Tests: Recent Labs  Lab 01/29/23 0243  AST 24  ALT 16  ALKPHOS 83  BILITOT 0.8  PROT 5.6*  ALBUMIN 2.8*   No results for input(s): "LIPASE", "AMYLASE" in the last 168 hours. No results for input(s): "AMMONIA" in the last 168 hours. Coagulation Profile: No results for input(s): "INR", "PROTIME" in the last 168 hours. Cardiac Enzymes: No results for input(s): "CKTOTAL", "CKMB", "CKMBINDEX", "TROPONINI" in the last 168 hours. BNP (last 3 results) No results for input(s): "PROBNP" in the last 8760 hours. HbA1C: No results for input(s): "HGBA1C" in the last 72 hours. CBG: No results for input(s): "GLUCAP" in the last 168 hours. Lipid Profile: No results for input(s): "CHOL", "HDL", "LDLCALC", "TRIG", "CHOLHDL", "LDLDIRECT" in the last 72 hours. Thyroid Function Tests: No results for input(s): "TSH", "T4TOTAL", "FREET4", "T3FREE", "THYROIDAB" in the last 72 hours. Anemia Panel: No results for input(s): "VITAMINB12", "FOLATE", "FERRITIN", "TIBC", "IRON", "RETICCTPCT" in the last 72 hours. Sepsis Labs: No results for input(s): "PROCALCITON", "LATICACIDVEN" in the last 168 hours.  Recent Results (from the past 240  hour(s))  Remove and replace urinary cath (placed > 5 days) then obtain urine culture from new indwelling urinary catheter.     Status: Abnormal (Preliminary result)   Collection Time: 01/27/23  8:28 PM   Specimen: Urine, Catheterized  Result Value Ref Range Status   Specimen Description URINE, CATHETERIZED  Final   Special Requests NONE  Final   Culture (A)  Final    >=100,000 COLONIES/mL GRAM NEGATIVE RODS IDENTIFICATION AND SUSCEPTIBILITIES TO FOLLOW Performed at [facility], [address] [place], [place] [address]    Report Status PENDING  Incomplete  Gastrointestinal Panel by PCR , Stool     Status: None   Collection Time: 01/27/23 11:50 PM   Specimen: Stool  Result Value Ref Range Status   Campylobacter species NOT DETECTED NOT DETECTED Final   Plesimonas shigelloides NOT DETECTED NOT DETECTED Final   Salmonella species NOT DETECTED NOT DETECTED Final   Yersinia enterocolitica NOT DETECTED NOT DETECTED Final   Vibrio species NOT  DETECTED NOT DETECTED Final   Vibrio cholerae NOT DETECTED NOT DETECTED Final   Enteroaggregative E coli (EAEC) NOT DETECTED NOT DETECTED Final   Enteropathogenic E coli (EPEC) NOT DETECTED NOT DETECTED Final   Enterotoxigenic E coli (ETEC) NOT DETECTED NOT DETECTED Final   Shiga like toxin producing E coli (STEC) NOT DETECTED NOT DETECTED Final   Shigella/Enteroinvasive E coli (EIEC) NOT DETECTED NOT DETECTED Final   Cryptosporidium NOT DETECTED NOT DETECTED Final   Cyclospora cayetanensis NOT DETECTED NOT DETECTED Final   Entamoeba histolytica NOT DETECTED NOT DETECTED Final   Giardia lamblia NOT DETECTED NOT DETECTED Final   Adenovirus F40/41 NOT DETECTED NOT DETECTED Final   Astrovirus NOT DETECTED NOT DETECTED Final   Norovirus GI/GII NOT DETECTED NOT DETECTED Final   Rotavirus A NOT DETECTED NOT DETECTED Final   Sapovirus (I, II, IV, and V) NOT DETECTED NOT DETECTED Final    Comment: Performed at [facility], [address] [place], [place] [address]         Radiology Studies: CT Abdomen Pelvis W Contrast  Result Date: 01/27/2023 CLINICAL DATA:  Abdominal pain, difficulty urinating, decreased appetite, diarrhea, history of prostate cancer EXAM: CT ABDOMEN AND PELVIS WITH CONTRAST TECHNIQUE: Multidetector CT imaging of the abdomen and pelvis was performed using the standard protocol following bolus administration of intravenous contrast. RADIATION DOSE REDUCTION: This exam was performed according to the departmental dose-optimization program which includes automated exposure control, adjustment of the mA and/or kV according to patient size and/or use of iterative reconstruction technique. CONTRAST:  63mL OMNIPAQUE IOHEXOL 350 MG/ML SOLN COMPARISON:  12/31/2021 FINDINGS: Lower chest: No acute pleural or parenchymal lung disease. Hepatobiliary: Stable hepatic cyst abutting the falciform ligament. Otherwise unremarkable appearance of the liver and gallbladder. No biliary duct dilation. Pancreas: Unremarkable. No pancreatic ductal dilatation or surrounding inflammatory changes. Spleen: Normal in size without focal abnormality. Adrenals/Urinary Tract: Bladder is decompressed with a Foley catheter. There is marked irregular bladder wall thickening, with mucosal hyperenhancement and perivesicular fat stranding. Kidneys are unremarkable. No urinary tract calculi or obstruction. The adrenals are normal. Stomach/Bowel: No bowel obstruction or ileus. Normal appendix right lower quadrant. No bowel wall thickening or inflammatory change. Small hiatal hernia. Vascular/Lymphatic: Aortic atherosclerosis. No enlarged abdominal or pelvic lymph nodes. Interval resolution of the retroperitoneal and iliac adenopathy seen on prior PET scan. Reproductive: Fiduciary markers are seen within the prostate bed. Marked decreased size of the prostate since prior exam. Other: No free fluid or free intraperitoneal gas. Fat containing left inguinal hernia.  No bowel herniation. Musculoskeletal: There are no acute or destructive bony lesions. Chronic L1 compression deformity. Reconstructed images demonstrate no additional findings. IMPRESSION: 1. Marked irregular bladder wall thickening, with mucosal hyperenhancement and perivesicular fat stranding. Findings most consistent with acute cystitis given clinical presentation, though secondary involvement of the bladder due to prostate cancer cannot be completely excluded in light of previous PET CT findings. Correlation with urinalysis recommended. 2. Marked decreased size of prostate, with interval placement of fiduciary markers. 3. Interval resolution of the pathologic retroperitoneal and iliac adenopathy seen on prior PET scan. 4.  Aortic Atherosclerosis (ICD10-I70.0). Electronically Signed   By: Sharlet Salina M.D.   On: 01/27/2023 22:24           LOS: 2 days   Time spent= 35 mins    Nayden Czajka Joline Maxcy, MD [facility]  If 7PM-7AM, please contact night-coverage  01/29/2023, 8:03 AM

## 2023-01-29 NOTE — Care Management Important Message (Signed)
Important Message  Patient Details  Name: Michael Burch MRN: [id number] Date of Birth: 11/15/1940   Medicare Important Message Given:  Yes     Sherilyn Banker 01/29/2023, 12:10 PM

## 2023-01-29 NOTE — Progress Notes (Signed)
Pt family requested bladder scan. 323 mL residual. Mad MD aware. Ok with pt voiding on his own. Advised pt to make staff aware if it becomes uncomfortable. Pt and pt family verbalized understanding

## 2023-01-29 NOTE — Plan of Care (Signed)
  Problem: Clinical Measurements: Goal: Will remain free from infection Outcome: Not Progressing   Problem: Elimination: Goal: Will not experience complications related to urinary retention Outcome: Not Progressing

## 2023-01-30 DIAGNOSIS — N390 Urinary tract infection, site not specified: Secondary | ICD-10-CM | POA: Diagnosis not present

## 2023-01-30 LAB — CBC
HCT: 30.9 % — ABNORMAL LOW (ref 39.0–52.0)
Hemoglobin: 10.6 g/dL — ABNORMAL LOW (ref 13.0–17.0)
MCH: 32.6 pg (ref 26.0–34.0)
MCHC: 34.3 g/dL (ref 30.0–36.0)
MCV: 95.1 fL (ref 80.0–100.0)
Platelets: 150 10*3/uL (ref 150–400)
RBC: 3.25 MIL/uL — ABNORMAL LOW (ref 4.22–5.81)
RDW: 14 % (ref 11.5–15.5)
WBC: 4.4 10*3/uL (ref 4.0–10.5)
nRBC: 0 % (ref 0.0–0.2)

## 2023-01-30 LAB — COMPREHENSIVE METABOLIC PANEL
ALT: 17 U/L (ref 0–44)
AST: 28 U/L (ref 15–41)
Albumin: 2.9 g/dL — ABNORMAL LOW (ref 3.5–5.0)
Alkaline Phosphatase: 90 U/L (ref 38–126)
Anion gap: 7 (ref 5–15)
BUN: 13 mg/dL (ref 8–23)
CO2: 25 mmol/L (ref 22–32)
Calcium: 9.3 mg/dL (ref 8.9–10.3)
Chloride: 103 mmol/L (ref 98–111)
Creatinine, Ser: 1.16 mg/dL (ref 0.61–1.24)
GFR, Estimated: >60 mL/min (ref >60)
Glucose, Bld: 129 mg/dL — ABNORMAL HIGH (ref 70–99)
Potassium: 3.9 mmol/L (ref 3.5–5.1)
Sodium: 135 mmol/L (ref 135–145)
Total Bilirubin: 0.6 mg/dL (ref 0.3–1.2)
Total Protein: 6.1 g/dL — ABNORMAL LOW (ref 6.5–8.1)

## 2023-01-30 LAB — MAGNESIUM: Magnesium: 2.2 mg/dL (ref 1.7–2.4)

## 2023-01-30 NOTE — Progress Notes (Signed)
PROGRESS NOTE    Michael Burch  ZOX:[id number] DOB: Mar 08, 1941 DOA: 01/27/2023 PCP: Blair Heys, MD   Brief Narrative:  82 y.o. male with medical history significant for prostate cancer with metastasis who completed radiation 1 month ago and now presents with fevers, diarrhea, suprapubic pain, and urinary retention. He was recently started on Macrodantin for UTI and started doing self cath as needed a few days ago. CT of the abdomen and pelvis demonstrates markedly irregular urinary bladder wall thickening with mucosal hyperenhancement and perivesicular fat stranding.  Diarrhea resolved prior to hospitalization but GI panel was negative.  Urine cultures are growing GNR.  Assessment & Plan:  UTI, catheter associated urinary tract infection.  Prior to arrival Acute urinary retention Likely secondary to frequent self cath.  Previous history of ESBL in January 2024.  CT scan does not show any evidence of pyelonephritis, clinically does not have any CVA tenderness either.  We will treat this as acute cystitis with IV meropenem according to previous cultures -Monitor fever curve.  No evidence of leukocytosis  Prior provider discussed case with Dr. Alycia Rossetti on 4/4.  Also called Dr. Greggory Stallion his [facility] but he is on vacation.  Discussed case with Dr. Gwynn Burly infectious disease.  Agrees that we should treat patient with meropenem for 5 days total, if clinically improves we can give him fosfomycin on the last day of discharge and have him follow-up with [facility] oncology sooner than later. Will need a urology close ofllow up as well. Successfully voiding.   AKI  Admission Cr 1.40, now back to baseline   Diarrhea, resolved Nonspecific, GI panel is negative   Metastatic prostate cancer with involvement of retroperitoneal/pelvic lymph nodes - Followed by [facility] and [facility], tolerating darolutamide and leuprolide, completed radiation one month ago    DVT prophylaxis:  Lovenox Code Status: Full Family Communication: Discussed care with family Status is: Inpatient Anticipated IV antibiotics until 4/7     Diet Orders (From admission, onward)     Start     Ordered   01/27/23 2325  Diet regular Room service appropriate? Yes; Fluid consistency: Thin  Diet effective now       Question Answer Comment  Room service appropriate? Yes   Fluid consistency: Thin      01/27/23 2325            Subjective: On burning, appettite better, no abdominal pain., able to void.   Examination: General: in Mild distress, No Rash Cardiovascular: S1 and S2 Present, No Murmur Respiratory: Good respiratory effort, Bilateral Air entry present. No Crackles, No wheezes Abdomen: Bowel Sound present, No tenderness Extremities: No edema Neuro: Alert and oriented x3, no new focal deficit   Objective: Vitals:   01/29/23 2114 01/30/23 0329 01/30/23 0341 01/30/23 0737  BP: (!) 105/57 129/76  126/77  Pulse: 83 79  78  Resp: Temp: 99.2 F (37.3 C) 98.9 F (37.2 C)  98.7 F (37.1 C)  TempSrc: Oral Oral  Oral  SpO2: 100% 98%  100%  Weight:   61.5 kg     Intake/Output Summary (Last 24 hours) at 01/30/2023 1510 Last data filed at 01/30/2023 1442 Gross per 24 hour  Intake 340 ml  Output 1950 ml  Net -1610 ml    Filed Weights   01/28/23 1100 01/29/23 0448 01/30/23 0341  Weight: 58 kg 61.8 kg 61.5 kg    Scheduled Meds:  calcium-vitamin D  1 tablet Oral BID  Chlorhexidine Gluconate Cloth  6 each Topical Daily   darolutamide  600 mg Oral BID   enoxaparin (LOVENOX) injection  40 mg Subcutaneous Q24H   Continuous Infusions:  meropenem (MERREM) IV 1 g (01/30/23 0950)    Nutritional status     Body mass index is 24.02 kg/m.  Data Reviewed:   CBC: Recent Labs  Lab 01/27/23 1812 01/28/23 0359 01/29/23 0243 01/30/23 0212  WBC 4.3 4.3 4.1 4.4  HGB 10.4* 10.1* 10.3* 10.6*  HCT 29.1* 28.7* 29.8* 30.9*  MCV 94.2 93.2 93.7 95.1  PLT 160 141*  149* 150    Basic Metabolic Panel: Recent Labs  Lab 01/27/23 1812 01/28/23 0359 01/29/23 0243 01/30/23 0212  NA 135 135 134* 135  K 4.1 3.8 3.9 3.9  CL 101 107 104 103  CO2 22 24 24 25   GLUCOSE 111* 108* 104* 129*  BUN 19 16 12 13   CREATININE 1.40* 1.11 1.03 1.16  CALCIUM 9.2 8.4* 9.0 9.3  MG  --   --  2.2 2.2    GFR: Estimated Creatinine Clearance: 41.6 mL/min (by C-G formula based on SCr of 1.16 mg/dL). Liver Function Tests: Recent Labs  Lab 01/29/23 0243 01/30/23 0212  AST 24 28  ALT 16 17  ALKPHOS 83 90  BILITOT 0.8 0.6  PROT 5.6* 6.1*  ALBUMIN 2.8* 2.9*    No results for input(s): "LIPASE", "AMYLASE" in the last 168 hours. No results for input(s): "AMMONIA" in the last 168 hours. Coagulation Profile: No results for input(s): "INR", "PROTIME" in the last 168 hours. Cardiac Enzymes: No results for input(s): "CKTOTAL", "CKMB", "CKMBINDEX", "TROPONINI" in the last 168 hours. BNP (last 3 results) No results for input(s): "PROBNP" in the last 8760 hours. HbA1C: No results for input(s): "HGBA1C" in the last 72 hours. CBG: No results for input(s): "GLUCAP" in the last 168 hours. Lipid Profile: No results for input(s): "CHOL", "HDL", "LDLCALC", "TRIG", "CHOLHDL", "LDLDIRECT" in the last 72 hours. Thyroid Function Tests: No results for input(s): "TSH", "T4TOTAL", "FREET4", "T3FREE", "THYROIDAB" in the last 72 hours. Anemia Panel: No results for input(s): "VITAMINB12", "FOLATE", "FERRITIN", "TIBC", "IRON", "RETICCTPCT" in the last 72 hours. Sepsis Labs: No results for input(s): "PROCALCITON", "LATICACIDVEN" in the last 168 hours.  Recent Results (from the past 240 hour(s))  Remove and replace urinary cath (placed > 5 days) then obtain urine culture from new indwelling urinary catheter.     Status: Abnormal (Preliminary result)   Collection Time: 01/27/23  8:28 PM   Specimen: Urine, Catheterized  Result Value Ref Range Status   Specimen Description URINE,  CATHETERIZED  Final   Special Requests NONE  Final   Culture (A)  Final    >=100,000 COLONIES/mL GRAM NEGATIVE RODS REPEATING Performed at [facility], [address] [place], [place] [address]    Report Status PENDING  Incomplete  Gastrointestinal Panel by PCR , Stool     Status: None   Collection Time: 01/27/23 11:50 PM   Specimen: Stool  Result Value Ref Range Status   Campylobacter species NOT DETECTED NOT DETECTED Final   Plesimonas shigelloides NOT DETECTED NOT DETECTED Final   Salmonella species NOT DETECTED NOT DETECTED Final   Yersinia enterocolitica NOT DETECTED NOT DETECTED Final   Vibrio species NOT DETECTED NOT DETECTED Final   Vibrio cholerae NOT DETECTED NOT DETECTED Final   Enteroaggregative E coli (EAEC) NOT DETECTED NOT DETECTED Final   Enteropathogenic E coli (EPEC) NOT DETECTED NOT DETECTED Final   Enterotoxigenic E coli (  ETEC) NOT DETECTED NOT DETECTED Final   Shiga like toxin producing E coli (STEC) NOT DETECTED NOT DETECTED Final   Shigella/Enteroinvasive E coli (EIEC) NOT DETECTED NOT DETECTED Final   Cryptosporidium NOT DETECTED NOT DETECTED Final   Cyclospora cayetanensis NOT DETECTED NOT DETECTED Final   Entamoeba histolytica NOT DETECTED NOT DETECTED Final   Giardia lamblia NOT DETECTED NOT DETECTED Final   Adenovirus F40/41 NOT DETECTED NOT DETECTED Final   Astrovirus NOT DETECTED NOT DETECTED Final   Norovirus GI/GII NOT DETECTED NOT DETECTED Final   Rotavirus A NOT DETECTED NOT DETECTED Final   Sapovirus (I, II, IV, and V) NOT DETECTED NOT DETECTED Final    Comment: Performed at [facility], [address] [place], [place] [address]         Radiology Studies: No results found.         LOS: 3 days   Time spent= 35 mins    Lynden Oxford, MD [facility]  If 7PM-7AM, please contact night-coverage  01/30/2023, 3:10 PM

## 2023-01-31 DIAGNOSIS — N390 Urinary tract infection, site not specified: Secondary | ICD-10-CM | POA: Diagnosis not present

## 2023-01-31 LAB — CBC
HCT: 29.3 % — ABNORMAL LOW (ref 39.0–52.0)
Hemoglobin: 10.4 g/dL — ABNORMAL LOW (ref 13.0–17.0)
MCH: 33.4 pg (ref 26.0–34.0)
MCHC: 35.5 g/dL (ref 30.0–36.0)
MCV: 94.2 fL (ref 80.0–100.0)
Platelets: 144 10*3/uL — ABNORMAL LOW (ref 150–400)
RBC: 3.11 MIL/uL — ABNORMAL LOW (ref 4.22–5.81)
RDW: 14.1 % (ref 11.5–15.5)
WBC: 4.4 10*3/uL (ref 4.0–10.5)
nRBC: 0 % (ref 0.0–0.2)

## 2023-01-31 LAB — COMPREHENSIVE METABOLIC PANEL
ALT: 18 U/L (ref 0–44)
AST: 27 U/L (ref 15–41)
Albumin: 2.9 g/dL — ABNORMAL LOW (ref 3.5–5.0)
Alkaline Phosphatase: 98 U/L (ref 38–126)
Anion gap: 5 (ref 5–15)
BUN: 14 mg/dL (ref 8–23)
CO2: 25 mmol/L (ref 22–32)
Calcium: 9 mg/dL (ref 8.9–10.3)
Chloride: 102 mmol/L (ref 98–111)
Creatinine, Ser: 1.14 mg/dL (ref 0.61–1.24)
GFR, Estimated: >60 mL/min (ref >60)
Glucose, Bld: 116 mg/dL — ABNORMAL HIGH (ref 70–99)
Potassium: 3.7 mmol/L (ref 3.5–5.1)
Sodium: 132 mmol/L — ABNORMAL LOW (ref 135–145)
Total Bilirubin: 0.7 mg/dL (ref 0.3–1.2)
Total Protein: 5.8 g/dL — ABNORMAL LOW (ref 6.5–8.1)

## 2023-01-31 LAB — MAGNESIUM: Magnesium: 2.2 mg/dL (ref 1.7–2.4)

## 2023-01-31 MED ORDER — FOSFOMYCIN TROMETHAMINE 3 G PO PACK
3.0000 g | PACK | Freq: Once | ORAL | Status: AC
  Administered 2023-02-01: 3 g via ORAL
  Filled 2023-01-31: qty 3

## 2023-01-31 MED ORDER — SACCHAROMYCES BOULARDII 250 MG PO CAPS
250.0000 mg | ORAL_CAPSULE | Freq: Two times a day (BID) | ORAL | Status: DC
  Administered 2023-01-31 – 2023-02-01 (×3): 250 mg via ORAL
  Filled 2023-01-31 (×3): qty 1

## 2023-01-31 MED ORDER — SODIUM CHLORIDE 0.9 % IV SOLN
1.0000 g | Freq: Two times a day (BID) | INTRAVENOUS | Status: AC
  Administered 2023-01-31 – 2023-02-01 (×3): 1 g via INTRAVENOUS
  Filled 2023-01-31 (×3): qty 20

## 2023-01-31 MED ORDER — LOPERAMIDE HCL 2 MG PO CAPS
2.0000 mg | ORAL_CAPSULE | ORAL | Status: DC | PRN
  Administered 2023-01-31 (×3): 2 mg via ORAL
  Filled 2023-01-31 (×3): qty 1

## 2023-01-31 NOTE — Progress Notes (Signed)
[facility] Progress Note Patient: Michael Burch UUV:[id number] DOB: [birth year] DOA: 01/27/2023  DOS: the patient was seen and examined on 01/31/2023  Brief hospital course: 82 y.o. male with medical history significant for prostate cancer with metastasis who completed radiation 1 month ago and now presents with fevers, diarrhea, suprapubic pain, and urinary retention. He was recently started on Macrodantin for UTI and started doing self cath as needed a few days ago. CT of the abdomen and pelvis demonstrates markedly irregular urinary bladder wall thickening with mucosal hyperenhancement and perivesicular fat stranding.  Diarrhea resolved prior to hospitalization but GI panel was negative.  Urine cultures are growing GNR.  Assessment and Plan: ESBL UTI. Acute urinary retention. Patient has history of ESBL UTI in January 24. Presents with complaints of abdominal pain and urinary retention. CT scan negative for any pyelonephritis. Started on IV meropenem after discussion with the ID with the plan for total 5-day treatment course. Last dose on 4/18 in the morning.  Will complete with fosfomycin. Urine culture still pending at this time. Discussed with urology Dr. Pete Glatter, recommend patient to contact the office for follow-up. Provider discussed with ID provider  Prostate cancer. Diarrhea associate with radiation. [facility] follows [facility]. tolerating darolutamide and leuprolide, completed radiation one month ago  Has chronic diarrhea. GI pathogen panel negative. Imodium as needed.  Will monitor. Outpatient follow-up with urology recommended secondary retention.  Acute kidney injury. Renal function close and back to baseline. Monitor.   Subjective: No nausea no vomiting.  Reported loose BM.  No blood in the stool.  No fever no chills.  No abdominal pain.  Physical Exam: General: in Mild distress, No Rash Cardiovascular: S1 and S2 Present, No  Murmur Respiratory: Good respiratory effort, Bilateral Air entry present. No Crackles, No wheezes Abdomen: Bowel Sound present, No tenderness Extremities: No edema Neuro: Alert and oriented x3, no new focal deficit  Data Reviewed: I have Reviewed nursing notes, Vitals, and Lab results. Since last encounter, pertinent lab results CBC and BMP   . I have ordered test including CBC and BMP  .   Disposition: Status is: Inpatient Remains inpatient appropriate because: Need for IV antibiotics  enoxaparin (LOVENOX) injection 40 mg Start: 01/29/23 1000   Family Communication: Family at bedside Level of care: Med-Surg   Vitals:   01/30/23 2041 01/31/23 0424 01/31/23 0427 01/31/23 0910  BP: 125/82 118/72  103/72  Pulse: 84 73  87  Resp:  16  17  Temp: 98.6 F (37 C) 98.8 F (37.1 C)  98.1 F (36.7 C)  TempSrc: Oral Oral  Oral  SpO2: 100% 96%  99%  Weight:   61 kg      Author: Lynden Oxford, MD 01/31/2023 3:41 PM  Please look on [email or web address] to find out who is on call.

## 2023-01-31 NOTE — Hospital Course (Signed)
82 y.o. male with medical history significant for prostate cancer with metastasis who completed radiation 1 month ago and now presents with fevers, diarrhea, suprapubic pain, and urinary retention. He was recently started on Macrodantin for UTI and started doing self cath as needed a few days ago. CT of the abdomen and pelvis demonstrates markedly irregular urinary bladder wall thickening with mucosal hyperenhancement and perivesicular fat stranding.  Diarrhea resolved prior to hospitalization but GI panel was negative.  Urine cultures are growing GNR.

## 2023-01-31 NOTE — Plan of Care (Signed)
  Problem: Health Behavior/Discharge Planning: Goal: Ability to manage health-related needs will improve Outcome: Progressing   Problem: Clinical Measurements: Goal: Ability to maintain clinical measurements within normal limits will improve Outcome: Progressing Goal: Will remain free from infection Outcome: Progressing   Problem: Activity: Goal: Risk for activity intolerance will decrease Outcome: Progressing   Problem: Coping: Goal: Level of anxiety will decrease Outcome: Progressing   

## 2023-02-01 DIAGNOSIS — N390 Urinary tract infection, site not specified: Secondary | ICD-10-CM | POA: Diagnosis not present

## 2023-02-01 LAB — URINE CULTURE: Culture: >=100000 — AB

## 2023-02-01 LAB — COMPREHENSIVE METABOLIC PANEL
ALT: 19 U/L (ref 0–44)
AST: 29 U/L (ref 15–41)
Albumin: 3 g/dL — ABNORMAL LOW (ref 3.5–5.0)
Alkaline Phosphatase: 101 U/L (ref 38–126)
Anion gap: 6 (ref 5–15)
BUN: 14 mg/dL (ref 8–23)
CO2: 25 mmol/L (ref 22–32)
Calcium: 9.2 mg/dL (ref 8.9–10.3)
Chloride: 102 mmol/L (ref 98–111)
Creatinine, Ser: 1.08 mg/dL (ref 0.61–1.24)
GFR, Estimated: >60 mL/min (ref >60)
Glucose, Bld: 113 mg/dL — ABNORMAL HIGH (ref 70–99)
Potassium: 4.1 mmol/L (ref 3.5–5.1)
Sodium: 133 mmol/L — ABNORMAL LOW (ref 135–145)
Total Bilirubin: 0.6 mg/dL (ref 0.3–1.2)
Total Protein: 6 g/dL — ABNORMAL LOW (ref 6.5–8.1)

## 2023-02-01 LAB — CBC
HCT: 31.2 % — ABNORMAL LOW (ref 39.0–52.0)
Hemoglobin: 10.7 g/dL — ABNORMAL LOW (ref 13.0–17.0)
MCH: 33 pg (ref 26.0–34.0)
MCHC: 34.3 g/dL (ref 30.0–36.0)
MCV: 96.3 fL (ref 80.0–100.0)
Platelets: 167 10*3/uL (ref 150–400)
RBC: 3.24 MIL/uL — ABNORMAL LOW (ref 4.22–5.81)
RDW: 14.1 % (ref 11.5–15.5)
WBC: 4.6 10*3/uL (ref 4.0–10.5)
nRBC: 0 % (ref 0.0–0.2)

## 2023-02-01 LAB — MAGNESIUM: Magnesium: 2.1 mg/dL (ref 1.7–2.4)

## 2023-02-01 MED ORDER — LOPERAMIDE HCL 2 MG PO CAPS: 2.0000 mg | ORAL_CAPSULE | ORAL | 0 refills | Status: AC | PRN

## 2023-02-01 MED ORDER — SACCHAROMYCES BOULARDII 250 MG PO CAPS: 250.0000 mg | ORAL_CAPSULE | Freq: Two times a day (BID) | ORAL | 0 refills | Status: AC

## 2023-02-01 NOTE — Care Management Important Message (Signed)
Important Message  Patient Details  Name: Michael Burch MRN: [id number] Date of Birth: 01/03/1941   Medicare Important Message Given:  Yes     Sherilyn Banker 02/01/2023, 3:31 PM

## 2023-02-01 NOTE — Progress Notes (Signed)
Pharmacy Antibiotic Note  Michael Burch is a 82 y.o. male admitted on 01/27/2023 with UTI.  H/O ESBL E. Coli 1/24.  Pharmacy has been consulted for meropenem dosing. UCX with GNR being repeated. Planning for 5 day course of meropenem then fosfomycin 1x at d/c. Likely to d/c today per notes.   Plan: Meropenem 1 g IV q12h Weight: 61.2 kg (134 lb 14.7 oz)  Temp (24hrs), Avg:98.4 F (36.9 C), Min:98.1 F (36.7 C), Max:98.6 F (37 C)  Recent Labs  Lab 01/28/23 0359 01/29/23 0243 01/30/23 0212 01/31/23 0303 02/01/23 0239  WBC 4.3 4.1 4.4 4.4 4.6  CREATININE 1.11 1.03 1.16 1.14 1.08     Estimated Creatinine Clearance: 44.6 mL/min (by C-G formula based on SCr of 1.08 mg/dL).    No Known Allergies  Estill Batten, PharmD, BCCCP  02/01/2023 8:36 AM

## 2023-02-01 NOTE — Discharge Summary (Signed)
Physician Discharge Summary   Patient: Michael HammersmithShantilal Herendeen MRN: [id number] DOB: October 31, 1940  Admit date:     01/27/2023  Discharge date: 02/01/2023  Discharge Physician: Lynden OxfordPranav Zyia Kaneko  PCP: Blair HeysEhinger, Robert, MD  Recommendations at discharge: Follow-up with PCP in 1 week. Follow-up with urology in 1 to 2 weeks. Follow-up with radiation oncology as scheduled.   Follow-up Information     Blair HeysEhinger, Robert, MD. Schedule an appointment as soon as possible for a visit in 1 week(s).   Specialty: Family Medicine Contact information: [address] [place] [address] [phone]         Bjorn PippinWrenn, John, MD. Schedule an appointment as soon as possible for a visit in 2 week(s).   Specialty: Urology Contact information: [address] [place] [address] [phone]                Discharge Diagnoses: Principal Problem:   Complicated UTI (urinary tract infection) Active Problems:   Acute urinary retention   AKI (acute kidney injury)   Diarrhea  Hospital Course: 82 y.o. male with medical history significant for prostate cancer with metastasis who completed radiation 1 month ago and now presents with fevers, diarrhea, suprapubic pain, and urinary retention. He was recently started on Macrodantin for UTI and started doing self cath as needed a few days ago. CT of the abdomen and pelvis demonstrates markedly irregular urinary bladder wall thickening with mucosal hyperenhancement and perivesicular fat stranding.  Diarrhea resolved prior to hospitalization but GI panel was negative.  Urine cultures are growing GNR.  Assessment and Plan  ESBL E. COLI UTI. Acute urinary retention. Patient has history of ESBL UTI in January 24.  This E. coli was sensitive to Macrobid. Have some urinary symptoms and was started on outpatient Macrobid. Presents ER with complaints of abdominal pain and urinary retention. CT scan negative for any pyelonephritis. Started on IV meropenem after  discussion with the ID with the plan for total 5-day treatment course. Last dose on 4/18 in the morning.  Will complete with fosfomycin. Urine culture growing ES BL E. coli resistant to Macrobid. Discussed with urology Dr. Pete GlatterStoneking, recommend patient to contact the office for follow-up. Prior provider also discussed with ID provider   Prostate cancer. Diarrhea associate with radiation.  And antibiotics [facility], [facility]. tolerating darolutamide and leuprolide, completed radiation one month ago  Has chronic diarrhea. GI pathogen panel negative. Imodium as needed.  Will monitor. On the day of the discharge patient had some a soft BM.  Continue probiotics. Outpatient follow-up with urology recommended due to urinary retention.   Acute kidney injury. Baseline serum creatinine 1.  On admission serum creatinine 1.4. Renal function back to baseline. Monitor.   Consultants:  None. discussed with urology and ID  Procedures performed:  None  DISCHARGE MEDICATION: Allergies as of 02/01/2023   No Known Allergies      Medication List     STOP taking these medications    nitrofurantoin (macrocrystal-monohydrate) 100 MG capsule Commonly known as: MACROBID       TAKE these medications    CALCIUM 600 + D PO Take 1 tablet by mouth 2 (two) times daily.   darolutamide 300 MG tablet Commonly known as: NUBEQA Take 600 mg by mouth 2 (two) times daily.   loperamide 2 MG capsule Commonly known as: IMODIUM Take 1 capsule (2 mg total) by mouth as needed for diarrhea or loose stools.   saccharomyces boulardii 250 MG capsule Commonly known  as: FLORASTOR Take 1 capsule (250 mg total) by mouth 2 (two) times daily.   traMADol 50 MG tablet Commonly known as: ULTRAM Take 50 mg by mouth every 6 (six) hours as needed for moderate pain.       Disposition: Home Diet recommendation: Regular diet  Discharge Exam: Vitals:   01/31/23 1921 02/01/23 0348  02/01/23 0500 02/01/23 0754  BP: 105/77 126/66  (!) 105/54  Pulse: 95 75  70  Resp: 15 16  16   Temp: 98.5 F (36.9 C) 98.6 F (37 C)  98.6 F (37 C)  TempSrc: Oral Oral  Oral  SpO2: 99% 98%  98%  Weight:   61.2 kg    General: Appear in mild distress; no visible Abnormal Neck Mass Or lumps, Conjunctiva normal Cardiovascular: S1 and S2 Present, no Murmur, Respiratory: good respiratory effort, Bilateral Air entry present and CTA, no Crackles, no wheezes Abdomen: Bowel Sound present, Non tender  Extremities: no Pedal edema Neurology: alert and oriented to time, place, and person  Filed Weights   01/30/23 0341 01/31/23 0427 02/01/23 0500  Weight: 61.5 kg 61 kg 61.2 kg   Condition at discharge: stable  The results of significant diagnostics from this hospitalization (including imaging, microbiology, ancillary and laboratory) are listed below for reference.   Imaging Studies: CT Abdomen Pelvis W Contrast  Result Date: 01/27/2023 CLINICAL DATA:  Abdominal pain, difficulty urinating, decreased appetite, diarrhea, history of prostate cancer EXAM: CT ABDOMEN AND PELVIS WITH CONTRAST TECHNIQUE: Multidetector CT imaging of the abdomen and pelvis was performed using the standard protocol following bolus administration of intravenous contrast. RADIATION DOSE REDUCTION: This exam was performed according to the departmental dose-optimization program which includes automated exposure control, adjustment of the mA and/or kV according to patient size and/or use of iterative reconstruction technique. CONTRAST:  75mL OMNIPAQUE IOHEXOL 350 MG/ML SOLN COMPARISON:  12/31/2021 FINDINGS: Lower chest: No acute pleural or parenchymal lung disease. Hepatobiliary: Stable hepatic cyst abutting the falciform ligament. Otherwise unremarkable appearance of the liver and gallbladder. No biliary duct dilation. Pancreas: Unremarkable. No pancreatic ductal dilatation or surrounding inflammatory changes. Spleen: Normal in size  without focal abnormality. Adrenals/Urinary Tract: Bladder is decompressed with a Foley catheter. There is marked irregular bladder wall thickening, with mucosal hyperenhancement and perivesicular fat stranding. Kidneys are unremarkable. No urinary tract calculi or obstruction. The adrenals are normal. Stomach/Bowel: No bowel obstruction or ileus. Normal appendix right lower quadrant. No bowel wall thickening or inflammatory change. Small hiatal hernia. Vascular/Lymphatic: Aortic atherosclerosis. No enlarged abdominal or pelvic lymph nodes. Interval resolution of the retroperitoneal and iliac adenopathy seen on prior PET scan. Reproductive: Fiduciary markers are seen within the prostate bed. Marked decreased size of the prostate since prior exam. Other: No free fluid or free intraperitoneal gas. Fat containing left inguinal hernia. No bowel herniation. Musculoskeletal: There are no acute or destructive bony lesions. Chronic L1 compression deformity. Reconstructed images demonstrate no additional findings. IMPRESSION: 1. Marked irregular bladder wall thickening, with mucosal hyperenhancement and perivesicular fat stranding. Findings most consistent with acute cystitis given clinical presentation, though secondary involvement of the bladder due to prostate cancer cannot be completely excluded in light of previous PET CT findings. Correlation with urinalysis recommended. 2. Marked decreased size of prostate, with interval placement of fiduciary markers. 3. Interval resolution of the pathologic retroperitoneal and iliac adenopathy seen on prior PET scan. 4.  Aortic Atherosclerosis (ICD10-I70.0). Electronically Signed   By: Sharlet Salina M.D.   On: 01/27/2023 22:24    Microbiology: Results  for orders placed or performed during the hospital encounter of 01/27/23  Remove and replace urinary cath (placed > 5 days) then obtain urine culture from new indwelling urinary catheter.     Status: Abnormal   Collection Time:  01/27/23  8:28 PM   Specimen: Urine, Catheterized  Result Value Ref Range Status   Specimen Description URINE, CATHETERIZED  Final   Special Requests NONE  Final   Culture (A)  Final    >=100,000 COLONIES/mL ESCHERICHIA COLI Confirmed Extended Spectrum Beta-Lactamase Producer (ESBL).  In bloodstream infections from ESBL organisms, carbapenems are preferred over piperacillin/tazobactam. They are shown to have a lower risk of mortality. MULTI-DRUG RESISTANT ORGANISM CRITICAL RESULT CALLED TO, READ BACK BY AND VERIFIED WITH: RN Caren Griffins [id number] FCP Performed at [facility], [address], [place], [place] [address]    Report Status 02/01/2023 FINAL  Final   Organism ID, Bacteria ESCHERICHIA COLI (A)  Final      Susceptibility   Escherichia coli - MIC*    AMPICILLIN >=32 RESISTANT Resistant     CEFAZOLIN >=64 RESISTANT Resistant     CEFEPIME >=32 RESISTANT Resistant     CEFTRIAXONE >=64 RESISTANT Resistant     CIPROFLOXACIN >=4 RESISTANT Resistant     GENTAMICIN >=16 RESISTANT Resistant     IMIPENEM 0.5 SENSITIVE Sensitive     NITROFURANTOIN >=512 RESISTANT Resistant     TRIMETH/SULFA >=320 RESISTANT Resistant     AMPICILLIN/SULBACTAM >=32 RESISTANT Resistant     PIP/TAZO <=4 SENSITIVE Sensitive     * >=100,000 COLONIES/mL ESCHERICHIA COLI  Gastrointestinal Panel by PCR , Stool     Status: None   Collection Time: 01/27/23 11:50 PM   Specimen: Stool  Result Value Ref Range Status   Campylobacter species NOT DETECTED NOT DETECTED Final   Plesimonas shigelloides NOT DETECTED NOT DETECTED Final   Salmonella species NOT DETECTED NOT DETECTED Final   Yersinia enterocolitica NOT DETECTED NOT DETECTED Final   Vibrio species NOT DETECTED NOT DETECTED Final   Vibrio cholerae NOT DETECTED NOT DETECTED Final   Enteroaggregative E coli (EAEC) NOT DETECTED NOT DETECTED Final   Enteropathogenic E coli (EPEC) NOT DETECTED NOT DETECTED Final   Enterotoxigenic E coli (ETEC) NOT DETECTED  NOT DETECTED Final   Shiga like toxin producing E coli (STEC) NOT DETECTED NOT DETECTED Final   Shigella/Enteroinvasive E coli (EIEC) NOT DETECTED NOT DETECTED Final   Cryptosporidium NOT DETECTED NOT DETECTED Final   Cyclospora cayetanensis NOT DETECTED NOT DETECTED Final   Entamoeba histolytica NOT DETECTED NOT DETECTED Final   Giardia lamblia NOT DETECTED NOT DETECTED Final   Adenovirus F40/41 NOT DETECTED NOT DETECTED Final   Astrovirus NOT DETECTED NOT DETECTED Final   Norovirus GI/GII NOT DETECTED NOT DETECTED Final   Rotavirus A NOT DETECTED NOT DETECTED Final   Sapovirus (I, II, IV, and V) NOT DETECTED NOT DETECTED Final    Comment: Performed at [facility], [address] [place], [place] [address]   Labs: CBC: Recent Labs  Lab 01/28/23 0359 01/29/23 0243 01/30/23 0212 01/31/23 0303 02/01/23 0239  WBC 4.3 4.1 4.4 4.4 4.6  HGB 10.1* 10.3* 10.6* 10.4* 10.7*  HCT 28.7* 29.8* 30.9* 29.3* 31.2*  MCV 93.2 93.7 95.1 94.2 96.3  PLT 141* 149* 150 144* 167   Basic Metabolic Panel: Recent Labs  Lab 01/28/23 0359 01/29/23 0243 01/30/23 0212 01/31/23 0303 02/01/23 0239  NA 135 134* 135 132* 133*  K 3.8 3.9 3.9 3.7 4.1  CL 107 104 103 102 102  CO2 24 24 25 25 25   GLUCOSE 108* 104* 129* 116* 113*  BUN 16 12 13 14 14   CREATININE 1.11 1.03 1.16 1.14 1.08  CALCIUM 8.4* 9.0 9.3 9.0 9.2  MG  --  2.2 2.2 2.2 2.1   Liver Function Tests: Recent Labs  Lab 01/29/23 0243 01/30/23 0212 01/31/23 0303 02/01/23 0239  AST 24 28 27 29   ALT 16 17 18 19   ALKPHOS 83 90 98 101  BILITOT 0.8 0.6 0.7 0.6  PROT 5.6* 6.1* 5.8* 6.0*  ALBUMIN 2.8* 2.9* 2.9* 3.0*   CBG: No results for input(s): "GLUCAP" in the last 168 hours.  Discharge time spent: greater than 30 minutes.  Signed: Lynden Oxford, MD [facility] 02/01/2023

## 2023-02-01 NOTE — TOC CM/SW Note (Signed)
  Transition of Care [facility]) Screening Note   Patient Details  Name: Michael Burch Date of Birth: Jan 30, 1941   Transition of Care Department [facility]) has reviewed patient and no TOC needs have been identified at this time. We will continue to monitor patient advancement through interdisciplinary progression rounds. If new patient transition needs arise, please place a TOC consult.

## 2023-02-02 ENCOUNTER — Ambulatory Visit
Admission: RE | Admit: 2023-02-02 | Discharge: 2023-02-02 | Disposition: A | Discharge status: Home / Self Care | Payer: Medicare Other | Source: Ambulatory Visit | Attending: Nurse Practitioner | Admitting: Nurse Practitioner

## 2023-02-02 NOTE — Progress Notes (Signed)
                                                                                                                                                             Patient Name: Michael Burch MRN: [id number] DOB: October 24, 1941 Referring Physician: Julious Oka EVERETT Date of Service: 01/04/2023 [facility], [place]                                                        End Of Treatment Note  Diagnoses: C61-Malignant neoplasm of prostate  Cancer Staging: 83 y.o. gentleman with Stage IV, T4M1a metastatic prostate cancer involving retroperitoneal and pelvic lymph nodes with a current PSA of 0.11 on ADT with total androgen receptor blockade.   Intent: Curative  Radiation Treatment Dates: 12/08/2022 through 01/04/2023 Site Technique Total Dose (Gy) Dose per Fx (Gy) Completed Fx Beam Energies  Prostate: Prostate IMRT 55/55 2.75 20/20 6X   Narrative: The patient tolerated radiation therapy relatively well.  He did report modest fatigue, increased nocturia x 5 and occasional diarrhea that was managed with Imodium as needed.  Plan: The patient will receive a call in about one month from the radiation oncology department. He will continue follow up with Dr. [name] at [facility] and his local urologist, Dr. [name], as well.  ------------------------------------------------   Margaretmary Dys, MD [facility]  Radiation Oncology Direct Dial: [phone]  Fax: [phone] [email or web address]  Skype  LinkedIn

## 2023-02-02 NOTE — Progress Notes (Signed)
  Radiation Oncology         [phone] ________________________________  Name: Michael Burch MRN: [id number]  Date of Service: 02/02/2023  DOB: 21-Aug-1941  Post Treatment Telephone Note  Diagnosis:  Malignant neoplasm of prostate(as documented in provider EOT note)  Pre Treatment IPSS Score: 5 (as documented in the provider consult note)  The patient was not available for call today.   Patient has a scheduled follow up visit with his urologist, Dr. Annabell Howells, on 02/17/23 for ongoing surveillance. He was counseled that PSA levels will be drawn in the urology office, and was reassured that additional time is expected to improve bowel and bladder symptoms. He was encouraged to call back with concerns or questions regarding radiation.   [name], LPN

## 2023-02-05 ENCOUNTER — Telehealth: Payer: Self-pay

## 2023-02-05 NOTE — Telephone Encounter (Signed)
RN spoke with grandson on behalf of Michael Burch about chronic diarrhea has been taking up to 7-8 tabs a day non effective.  Brat diet not sure how much of this diet partaken but reiterated would help to bulk up stools.  As per [name], PA-C advised to switch to Pepto-Bismol and continue Brat diet as tolerated.  Advised to follow up with PCP to r/o C-diff colitis.  [name] was appreciative of the call and verbalized understanding will try advised suggestions and will contact PCP if non effective.

## 2023-02-08 DIAGNOSIS — C7951 Secondary malignant neoplasm of bone: Secondary | ICD-10-CM | POA: Diagnosis not present

## 2023-02-08 DIAGNOSIS — R350 Frequency of micturition: Secondary | ICD-10-CM | POA: Diagnosis not present

## 2023-02-08 DIAGNOSIS — C775 Secondary and unspecified malignant neoplasm of intrapelvic lymph nodes: Secondary | ICD-10-CM | POA: Diagnosis not present

## 2023-02-08 DIAGNOSIS — Z23 Encounter for immunization: Secondary | ICD-10-CM | POA: Diagnosis not present

## 2023-02-17 DIAGNOSIS — R3914 Feeling of incomplete bladder emptying: Secondary | ICD-10-CM | POA: Diagnosis not present

## 2023-02-26 DIAGNOSIS — R21 Rash and other nonspecific skin eruption: Secondary | ICD-10-CM | POA: Diagnosis not present

## 2023-03-08 DIAGNOSIS — H905 Unspecified sensorineural hearing loss: Secondary | ICD-10-CM | POA: Diagnosis not present

## 2023-03-10 DIAGNOSIS — C775 Secondary and unspecified malignant neoplasm of intrapelvic lymph nodes: Secondary | ICD-10-CM | POA: Diagnosis not present

## 2023-03-10 DIAGNOSIS — C7951 Secondary malignant neoplasm of bone: Secondary | ICD-10-CM | POA: Diagnosis not present

## 2023-03-30 DIAGNOSIS — R42 Dizziness and giddiness: Secondary | ICD-10-CM | POA: Diagnosis not present

## 2023-04-21 DIAGNOSIS — H43813 Vitreous degeneration, bilateral: Secondary | ICD-10-CM | POA: Diagnosis not present

## 2023-04-21 DIAGNOSIS — H5203 Hypermetropia, bilateral: Secondary | ICD-10-CM | POA: Diagnosis not present

## 2023-04-21 DIAGNOSIS — H2513 Age-related nuclear cataract, bilateral: Secondary | ICD-10-CM | POA: Diagnosis not present

## 2023-04-21 DIAGNOSIS — H25013 Cortical age-related cataract, bilateral: Secondary | ICD-10-CM | POA: Diagnosis not present

## 2023-04-21 DIAGNOSIS — H524 Presbyopia: Secondary | ICD-10-CM | POA: Diagnosis not present

## 2023-04-23 DIAGNOSIS — H353210 Exudative age-related macular degeneration, right eye, stage unspecified: Secondary | ICD-10-CM | POA: Diagnosis not present

## 2023-04-23 DIAGNOSIS — I700 Atherosclerosis of aorta: Secondary | ICD-10-CM | POA: Diagnosis not present

## 2023-04-23 DIAGNOSIS — R338 Other retention of urine: Secondary | ICD-10-CM | POA: Diagnosis not present

## 2023-04-23 DIAGNOSIS — H8113 Benign paroxysmal vertigo, bilateral: Secondary | ICD-10-CM | POA: Diagnosis not present

## 2023-04-23 DIAGNOSIS — Z8616 Personal history of COVID-19: Secondary | ICD-10-CM | POA: Diagnosis not present

## 2023-06-21 DIAGNOSIS — M79642 Pain in left hand: Secondary | ICD-10-CM | POA: Diagnosis not present

## 2023-06-21 DIAGNOSIS — M545 Low back pain, unspecified: Secondary | ICD-10-CM | POA: Diagnosis not present

## 2023-06-30 DIAGNOSIS — C775 Secondary and unspecified malignant neoplasm of intrapelvic lymph nodes: Secondary | ICD-10-CM | POA: Diagnosis not present

## 2023-06-30 DIAGNOSIS — Z23 Encounter for immunization: Secondary | ICD-10-CM | POA: Diagnosis not present

## 2023-08-04 DIAGNOSIS — R339 Retention of urine, unspecified: Secondary | ICD-10-CM | POA: Diagnosis not present

## 2023-08-04 DIAGNOSIS — K5902 Outlet dysfunction constipation: Secondary | ICD-10-CM | POA: Diagnosis not present

## 2023-08-04 DIAGNOSIS — C775 Secondary and unspecified malignant neoplasm of intrapelvic lymph nodes: Secondary | ICD-10-CM | POA: Diagnosis not present

## 2023-08-04 DIAGNOSIS — N138 Other obstructive and reflux uropathy: Secondary | ICD-10-CM | POA: Diagnosis not present

## 2023-08-04 DIAGNOSIS — M6289 Other specified disorders of muscle: Secondary | ICD-10-CM | POA: Diagnosis not present

## 2023-08-17 DIAGNOSIS — M25561 Pain in right knee: Secondary | ICD-10-CM | POA: Diagnosis not present

## 2023-08-17 DIAGNOSIS — M25562 Pain in left knee: Secondary | ICD-10-CM | POA: Diagnosis not present

## 2023-08-17 DIAGNOSIS — Z96651 Presence of right artificial knee joint: Secondary | ICD-10-CM | POA: Diagnosis not present

## 2023-08-17 DIAGNOSIS — M7632 Iliotibial band syndrome, left leg: Secondary | ICD-10-CM | POA: Diagnosis not present

## 2023-08-25 DIAGNOSIS — R3121 Asymptomatic microscopic hematuria: Secondary | ICD-10-CM | POA: Diagnosis not present

## 2023-09-21 DIAGNOSIS — C775 Secondary and unspecified malignant neoplasm of intrapelvic lymph nodes: Secondary | ICD-10-CM | POA: Diagnosis not present

## 2023-10-07 DIAGNOSIS — R339 Retention of urine, unspecified: Secondary | ICD-10-CM | POA: Diagnosis not present

## 2023-10-07 DIAGNOSIS — R3129 Other microscopic hematuria: Secondary | ICD-10-CM | POA: Diagnosis not present

## 2023-10-07 DIAGNOSIS — N3289 Other specified disorders of bladder: Secondary | ICD-10-CM | POA: Diagnosis not present

## 2023-10-07 DIAGNOSIS — R590 Localized enlarged lymph nodes: Secondary | ICD-10-CM | POA: Diagnosis not present

## 2023-10-14 DIAGNOSIS — R059 Cough, unspecified: Secondary | ICD-10-CM | POA: Diagnosis not present

## 2023-10-14 DIAGNOSIS — J989 Respiratory disorder, unspecified: Secondary | ICD-10-CM | POA: Diagnosis not present

## 2023-12-09 ENCOUNTER — Other Ambulatory Visit: Payer: Self-pay

## 2023-12-09 ENCOUNTER — Emergency Department (HOSPITAL_COMMUNITY)
Admission: EM | Admit: 2023-12-09 | Discharge: 2023-12-10 | Discharge status: Against Medical Advice | Payer: Medicare Other | Attending: Emergency Medicine | Admitting: Emergency Medicine

## 2023-12-09 ENCOUNTER — Encounter (HOSPITAL_COMMUNITY): Payer: Self-pay | Admitting: Emergency Medicine

## 2023-12-09 DIAGNOSIS — R300 Dysuria: Secondary | ICD-10-CM | POA: Insufficient documentation

## 2023-12-09 DIAGNOSIS — M545 Low back pain, unspecified: Secondary | ICD-10-CM | POA: Diagnosis present

## 2023-12-09 DIAGNOSIS — Z5321 Procedure and treatment not carried out due to patient leaving prior to being seen by health care provider: Secondary | ICD-10-CM | POA: Diagnosis not present

## 2023-12-09 DIAGNOSIS — R509 Fever, unspecified: Secondary | ICD-10-CM | POA: Insufficient documentation

## 2023-12-09 LAB — CBC WITH DIFFERENTIAL/PLATELET
Abs Immature Granulocytes: 0.02 10*3/uL (ref 0.00–0.07)
Basophils Absolute: 0 10*3/uL (ref 0.0–0.1)
Basophils Relative: 0 %
Eosinophils Absolute: 0.1 10*3/uL (ref 0.0–0.5)
Eosinophils Relative: 2 %
HCT: 33.6 % — ABNORMAL LOW (ref 39.0–52.0)
Hemoglobin: 11.6 g/dL — ABNORMAL LOW (ref 13.0–17.0)
Immature Granulocytes: 0 %
Lymphocytes Relative: 20 %
Lymphs Abs: 1.1 10*3/uL (ref 0.7–4.0)
MCH: 32.6 pg (ref 26.0–34.0)
MCHC: 34.5 g/dL (ref 30.0–36.0)
MCV: 94.4 fL (ref 80.0–100.0)
Monocytes Absolute: 0.7 10*3/uL (ref 0.1–1.0)
Monocytes Relative: 12 %
Neutro Abs: 3.6 10*3/uL (ref 1.7–7.7)
Neutrophils Relative %: 66 %
Platelets: 175 10*3/uL (ref 150–400)
RBC: 3.56 MIL/uL — ABNORMAL LOW (ref 4.22–5.81)
RDW: 12.7 % (ref 11.5–15.5)
WBC: 5.4 10*3/uL (ref 4.0–10.5)
nRBC: 0 % (ref 0.0–0.2)

## 2023-12-09 LAB — COMPREHENSIVE METABOLIC PANEL
ALT: 18 U/L (ref 0–44)
AST: 27 U/L (ref 15–41)
Albumin: 3.4 g/dL — ABNORMAL LOW (ref 3.5–5.0)
Alkaline Phosphatase: 77 U/L (ref 38–126)
Anion gap: 9 (ref 5–15)
BUN: 20 mg/dL (ref 8–23)
CO2: 21 mmol/L — ABNORMAL LOW (ref 22–32)
Calcium: 9.2 mg/dL (ref 8.9–10.3)
Chloride: 104 mmol/L (ref 98–111)
Creatinine, Ser: 1.42 mg/dL — ABNORMAL HIGH (ref 0.61–1.24)
GFR, Estimated: 50 mL/min — ABNORMAL LOW (ref >60)
Glucose, Bld: 125 mg/dL — ABNORMAL HIGH (ref 70–99)
Potassium: 3.9 mmol/L (ref 3.5–5.1)
Sodium: 134 mmol/L — ABNORMAL LOW (ref 135–145)
Total Bilirubin: 1.2 mg/dL (ref 0.0–1.2)
Total Protein: 6.6 g/dL (ref 6.5–8.1)

## 2023-12-09 LAB — URINALYSIS, ROUTINE W REFLEX MICROSCOPIC
Bilirubin Urine: NEGATIVE
Glucose, UA: NEGATIVE mg/dL
Ketones, ur: NEGATIVE mg/dL
Nitrite: POSITIVE — AB
Protein, ur: 100 mg/dL — AB
Specific Gravity, Urine: 1.009 (ref 1.005–1.030)
WBC, UA: >50 WBC/hpf (ref 0–5)
pH: 6 (ref 5.0–8.0)

## 2023-12-09 LAB — I-STAT CG4 LACTIC ACID, ED: Lactic Acid, Venous: 1.3 mmol/L (ref 0.5–1.9)

## 2023-12-09 NOTE — ED Notes (Signed)
1st lac was in normal range 2nd not needed can be canceled

## 2023-12-09 NOTE — ED Triage Notes (Signed)
Patient reports pain with urination and back pain x2 days. Currently doing self caths at home after urethra dilation on 1/27. Seen by urology today and dx with Uti and given fosfomycin. Noticed blood in urine today. Denies weakness or dizziness.

## 2023-12-09 NOTE — ED Provider Triage Note (Addendum)
Emergency Medicine Provider Triage Evaluation Note  Michael Burch , a 83 y.o. male  was evaluated in triage.  Pt complains of lower back pain and dysuria ongoing for past 2 days. Also with fever at home and has been taking tylenol and ibuprofen for fever.  Also reports blood when he wipes, but unsure if it is coming from his urethra or rectum.  Reports he had a urethral stricture dilated at [facility] recently, and it was initially fine after the surgery.  He was self cathing at home.  Was seen by his urologist earlier today and started on fosfomycin for potential UTI.  He reports he has a history of UTIs and has been hospitalized for them previously.  Family member at bedside also reports he may have been more confused as he did leave the stove on last night.  Review of Systems  Positive: As above Negative: As above  Physical Exam  BP 126/89 (BP Location: Right Arm)   Pulse (!) 109   Temp 99.5 F (37.5 C) (Oral)   Resp 18   SpO2 96%  Gen:   Awake, no distress   Resp:  Normal effort  MSK:   Moves extremities without difficulty    Medical Decision Making  Medically screening exam initiated at 9:43 PM.  Appropriate orders placed.  Michael Burch was informed that the remainder of the evaluation will be completed by another provider, this initial triage assessment does not replace that evaluation, and the importance of remaining in the ED until their evaluation is complete.     Arabella Merles, PA-C 12/09/23 2146    Arabella Merles, PA-C 12/09/23 2147

## 2023-12-10 NOTE — ED Notes (Signed)
Pt and family decided to leave the ED.

## 2023-12-10 NOTE — ED Notes (Signed)
Pt offered pain medication, Pt's family refused medication and is asking to speak to the doctor.

## 2023-12-11 LAB — URINE CULTURE: Culture: 20000 — AB

## 2023-12-12 ENCOUNTER — Telehealth (HOSPITAL_BASED_OUTPATIENT_CLINIC_OR_DEPARTMENT_OTHER): Payer: Self-pay | Admitting: *Deleted

## 2023-12-12 NOTE — Telephone Encounter (Signed)
 Post ED Visit - Positive Culture Follow-up  Culture report reviewed by antimicrobial stewardship pharmacist: [facility] []  Enzo Bi, Pharm.D. []  Celedonio Miyamoto, Pharm.D., BCPS AQ-ID []  Garvin Fila, Pharm.D., BCPS []  Georgina Pillion, Pharm.D., BCPS []  [place], [place].D., BCPS, AAHIVP []  Estella Husk, Pharm.D., BCPS, AAHIVP []  Lysle Pearl, PharmD, BCPS []  Phillips Climes, PharmD, BCPS []  Agapito Games, PharmD, BCPS []  Verlan Friends, PharmD []  Mervyn Gay, PharmD, BCPS [x] Delmar Landau, PharmD  [facility] []  Len Childs, PharmD []  Greer Pickerel, PharmD []  Adalberto Cole, PharmD []  Perlie Gold, Rph []  Lonell Face) Jean Rosenthal, PharmD []  Earl Many, PharmD []  Junita Push, PharmD []  Dorna Leitz, PharmD []  Terrilee Files, PharmD []  Lynann Beaver, PharmD []  Keturah Barre, PharmD []  Loralee Pacas, PharmD []  Bernadene Person, PharmD   Positive urine culture Pt got dose of fosfomycin and no further patient follow-up is required at this time.  Patsey Berthold 12/12/2023, 12:15 PM

## 2023-12-14 LAB — CULTURE, BLOOD (ROUTINE X 2)
Culture: NO GROWTH
Special Requests: ADEQUATE

## 2024-10-03 ENCOUNTER — Other Ambulatory Visit: Payer: Self-pay | Admitting: Family Medicine

## 2024-10-03 ENCOUNTER — Ambulatory Visit
Admission: RE | Admit: 2024-10-03 | Discharge: 2024-10-03 | Disposition: A | Source: Ambulatory Visit | Attending: Family Medicine | Admitting: Family Medicine

## 2024-10-03 DIAGNOSIS — R0789 Other chest pain: Secondary | ICD-10-CM

## 2024-10-25 ENCOUNTER — Encounter (HOSPITAL_COMMUNITY): Payer: Self-pay

## 2024-10-25 ENCOUNTER — Ambulatory Visit (INDEPENDENT_AMBULATORY_CARE_PROVIDER_SITE_OTHER)

## 2024-10-25 ENCOUNTER — Ambulatory Visit (HOSPITAL_COMMUNITY)
Admission: RE | Admit: 2024-10-25 | Discharge: 2024-10-25 | Disposition: A | Discharge status: Home / Self Care | Source: Ambulatory Visit

## 2024-10-25 VITALS — BP 132/81 | HR 90 | Temp 100.3°F | Resp 18

## 2024-10-25 DIAGNOSIS — R051 Acute cough: Secondary | ICD-10-CM

## 2024-10-25 DIAGNOSIS — J22 Unspecified acute lower respiratory infection: Secondary | ICD-10-CM

## 2024-10-25 LAB — POC COVID19/FLU A&B COMBO
Covid Antigen, POC: NEGATIVE
Influenza A Antigen, POC: NEGATIVE
Influenza B Antigen, POC: NEGATIVE

## 2024-10-25 MED ORDER — HYDROCODONE BIT-HOMATROP MBR 5-1.5 MG/5ML PO SOLN: 5.0000 mL | Freq: Four times a day (QID) | ORAL | 0 refills | Status: AC | PRN

## 2024-10-25 MED ORDER — IPRATROPIUM BROMIDE 0.03 % NA SOLN: 2.0000 | Freq: Two times a day (BID) | NASAL | 12 refills | Status: AC

## 2024-10-25 MED ORDER — AMOXICILLIN-POT CLAVULANATE 875-125 MG PO TABS: 1.0000 | ORAL_TABLET | Freq: Two times a day (BID) | ORAL | 0 refills | Status: AC

## 2024-10-25 NOTE — ED Triage Notes (Signed)
 Pts daughter declined interpreter advised she will translate.  Pts daughter states that the granddaughter had flu 10 days ago.   Pt has fever, cough, congestion, headache X 2 days/ She has been giving cold and flu meds and tylenol  last dose 8am
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
## 2024-10-25 NOTE — Discharge Instructions (Addendum)
 Flu A, flu B and COVID testing done today is negative.  Chest x-ray done today.  Final evaluation by the radiologist is still pending but on brief evaluation and when compared to previous chest x-rays there may be a small increase in density along the medial aspect of the right lung.  Due to the symptoms present and severity as well as the x-ray findings, we will cover this with antibiotics by mouth.  We will treat with the following: Augmentin 875 mg twice daily for 7 days.  This is an antibiotic.  Take this with food.  Ipratropium (Atrovent ) 2 sprays in both nostrils every 12 hours as needed for nasal congestion. Hycodan 5 mL every 6 hours as needed for cough.  Use caution as this medication can cause drowsiness.  Do not drive while you are taking this medication. Make sure to stay hydrated by drinking plenty of water . Return to urgent care or PCP if symptoms worsen or fail to resolve.

## 2024-10-30 ENCOUNTER — Ambulatory Visit: Payer: Self-pay
